# Patient Record
Sex: Female | Born: 2000 | Race: Black or African American | Hispanic: No | Marital: Single | Smoking: Never smoker
Health system: Southern US, Community
[De-identification: ages and names within clinical notes are randomized; demographics above are authoritative.]

## PROBLEM LIST (undated history)

## (undated) DIAGNOSIS — O24419 Gestational diabetes mellitus in pregnancy, unspecified control: Secondary | ICD-10-CM

## (undated) DIAGNOSIS — K2970 Gastritis, unspecified, without bleeding: Secondary | ICD-10-CM

## (undated) DIAGNOSIS — F419 Anxiety disorder, unspecified: Secondary | ICD-10-CM

## (undated) DIAGNOSIS — D649 Anemia, unspecified: Secondary | ICD-10-CM

## (undated) DIAGNOSIS — B009 Herpesviral infection, unspecified: Secondary | ICD-10-CM

## (undated) DIAGNOSIS — F32A Depression, unspecified: Secondary | ICD-10-CM

## (undated) DIAGNOSIS — F99 Mental disorder, not otherwise specified: Secondary | ICD-10-CM

---
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
## 2018-04-03 ENCOUNTER — Emergency Department (HOSPITAL_COMMUNITY)
Admission: EM | Admit: 2018-04-03 | Discharge: 2018-04-03 | Disposition: A | Discharge status: Home / Self Care | Payer: Medicaid Other | Attending: Emergency Medicine | Admitting: Emergency Medicine

## 2018-04-03 ENCOUNTER — Encounter (HOSPITAL_COMMUNITY): Payer: Self-pay

## 2018-04-03 ENCOUNTER — Other Ambulatory Visit: Payer: Self-pay

## 2018-04-03 DIAGNOSIS — R1013 Epigastric pain: Secondary | ICD-10-CM | POA: Insufficient documentation

## 2018-04-03 DIAGNOSIS — K2920 Alcoholic gastritis without bleeding: Secondary | ICD-10-CM | POA: Insufficient documentation

## 2018-04-03 DIAGNOSIS — F1290 Cannabis use, unspecified, uncomplicated: Secondary | ICD-10-CM

## 2018-04-03 DIAGNOSIS — F1210 Cannabis abuse, uncomplicated: Secondary | ICD-10-CM | POA: Insufficient documentation

## 2018-04-03 DIAGNOSIS — R112 Nausea with vomiting, unspecified: Secondary | ICD-10-CM | POA: Diagnosis not present

## 2018-04-03 LAB — CBC WITH DIFFERENTIAL/PLATELET
Basophils Absolute: 0 10*3/uL (ref 0.0–0.1)
Basophils Relative: 0 %
Eosinophils Absolute: 0 10*3/uL (ref 0.0–0.7)
Eosinophils Relative: 0 %
HCT: 37 % (ref 36.0–46.0)
HEMOGLOBIN: 12.3 g/dL (ref 12.0–15.0)
LYMPHS ABS: 0.7 10*3/uL (ref 0.7–4.0)
Lymphocytes Relative: 14 %
MCH: 30.1 pg (ref 26.0–34.0)
MCHC: 33.2 g/dL (ref 30.0–36.0)
MCV: 90.5 fL (ref 78.0–100.0)
MONO ABS: 0.1 10*3/uL (ref 0.1–1.0)
MONOS PCT: 2 %
NEUTROS ABS: 4.2 10*3/uL (ref 1.7–7.7)
Neutrophils Relative %: 84 %
Platelets: 252 10*3/uL (ref 150–400)
RBC: 4.09 MIL/uL (ref 3.87–5.11)
RDW: 13.3 % (ref 11.5–15.5)
WBC: 4.9 10*3/uL (ref 4.0–10.5)

## 2018-04-03 LAB — URINALYSIS, ROUTINE W REFLEX MICROSCOPIC
Bacteria, UA: NONE SEEN
Bilirubin Urine: NEGATIVE
Glucose, UA: NEGATIVE mg/dL
Hgb urine dipstick: NEGATIVE
KETONES UR: 5 mg/dL — AB
Leukocytes, UA: NEGATIVE
Nitrite: NEGATIVE
PH: 6 (ref 5.0–8.0)
PROTEIN: 100 mg/dL — AB
Specific Gravity, Urine: 1.031 — ABNORMAL HIGH (ref 1.005–1.030)

## 2018-04-03 LAB — COMPREHENSIVE METABOLIC PANEL
ALBUMIN: 4.5 g/dL (ref 3.5–5.0)
ALK PHOS: 55 U/L (ref 38–126)
ALT: 27 U/L (ref 0–44)
AST: 22 U/L (ref 15–41)
Anion gap: 13 (ref 5–15)
BUN: 15 mg/dL (ref 6–20)
CO2: 25 mmol/L (ref 22–32)
Calcium: 9.6 mg/dL (ref 8.9–10.3)
Chloride: 109 mmol/L (ref 98–111)
Creatinine, Ser: 0.81 mg/dL (ref 0.44–1.00)
GFR calc Af Amer: >60 mL/min (ref >60)
GFR calc non Af Amer: >60 mL/min (ref >60)
GLUCOSE: 108 mg/dL — AB (ref 70–99)
Potassium: 3.9 mmol/L (ref 3.5–5.1)
Sodium: 147 mmol/L — ABNORMAL HIGH (ref 135–145)
TOTAL PROTEIN: 8.5 g/dL — AB (ref 6.5–8.1)
Total Bilirubin: 0.5 mg/dL (ref 0.3–1.2)

## 2018-04-03 LAB — LIPASE, BLOOD: Lipase: 29 U/L (ref 11–51)

## 2018-04-03 LAB — I-STAT BETA HCG BLOOD, ED (MC, WL, AP ONLY): I-stat hCG, quantitative: <5 m[IU]/mL (ref <5)

## 2018-04-03 MED ORDER — RANITIDINE HCL 150 MG PO TABS: 150.0000 mg | ORAL_TABLET | Freq: Two times a day (BID) | ORAL | 0 refills | Status: DC

## 2018-04-03 MED ORDER — FAMOTIDINE IN NACL 20-0.9 MG/50ML-% IV SOLN
20.0000 mg | Freq: Once | INTRAVENOUS | Status: AC
  Administered 2018-04-03: 20 mg via INTRAVENOUS
  Filled 2018-04-03: qty 50

## 2018-04-03 MED ORDER — ONDANSETRON HCL 4 MG/2ML IJ SOLN
4.0000 mg | Freq: Once | INTRAMUSCULAR | Status: AC
  Administered 2018-04-03: 4 mg via INTRAVENOUS
  Filled 2018-04-03: qty 2

## 2018-04-03 MED ORDER — ONDANSETRON 4 MG PO TBDP: 4.0000 mg | ORAL_TABLET | Freq: Three times a day (TID) | ORAL | 0 refills | Status: DC | PRN

## 2018-04-03 MED ORDER — GI COCKTAIL ~~LOC~~
30.0000 mL | Freq: Once | ORAL | Status: AC
  Administered 2018-04-03: 30 mL via ORAL
  Filled 2018-04-03: qty 30

## 2018-04-03 MED ORDER — SODIUM CHLORIDE 0.9 % IV BOLUS
1000.0000 mL | Freq: Once | INTRAVENOUS | Status: AC
  Administered 2018-04-03: 1000 mL via INTRAVENOUS

## 2018-04-03 NOTE — Discharge Instructions (Addendum)
Your abdominal pain and nausea/vomiting is likely from gastritis or an ulcer, probably from your alcohol consumption last night, and potentially contributed to by your marijuana use. It's very important that you avoid alcohol and marijuana use! You will need to take zantac as directed, and avoid spicy/fatty/acidic foods, avoid soda/coffee/tea/alcohol. Avoid laying down flat within 30 minutes of eating. Avoid NSAIDs like ibuprofen/aleve/motrin/etc on an empty stomach. May consider using over the counter tums/maalox as needed for additional relief. Use zofran as directed as needed for nausea. Use tylenol as needed for pain. Follow up with your regular doctor in 5-7 days for recheck of symptoms. Return to the ER for changes or worsening symptoms.  Abdominal (belly) pain can be caused by many things. Your caregiver performed an examination and possibly ordered blood/urine tests and imaging (CT scan, x-rays, ultrasound). Many cases can be observed and treated at home after initial evaluation in the emergency department. Even though you are being discharged home, abdominal pain can be unpredictable. Therefore, you need a repeated exam if your pain does not resolve, returns, or worsens. Most patients with abdominal pain don't have to be admitted to the hospital or have surgery, but serious problems like appendicitis and gallbladder attacks can start out as nonspecific pain. Many abdominal conditions cannot be diagnosed in one visit, so follow-up evaluations are very important. SEEK IMMEDIATE MEDICAL ATTENTION IF YOU DEVELOP ANY OF THE FOLLOWING SYMPTOMS: The pain does not go away or becomes severe.  A temperature above 101 develops.  Repeated vomiting occurs (multiple episodes).  The pain becomes localized to portions of the abdomen. The right side could possibly be appendicitis. In an adult, the left lower portion of the abdomen could be colitis or diverticulitis.  Blood is being passed in stools or vomit (bright  red or black tarry stools).  Return also if you develop chest pain, difficulty breathing, dizziness or fainting, or become confused, poorly responsive, or inconsolable (young children). The constipation stays for more than 4 days.  There is belly (abdominal) or rectal pain.  You do not seem to be getting better.

## 2018-04-03 NOTE — ED Notes (Signed)
Pt's ultrasound IV infiltrated.
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
## 2018-04-03 NOTE — ED Triage Notes (Signed)
She c/o n/v x ~15 episodes since yesterday. She states she did have a "normal" b.m. Yesterday. She is with her mom and is in no distress.

## 2019-06-10 ENCOUNTER — Emergency Department (HOSPITAL_COMMUNITY): Payer: Medicaid Other

## 2019-06-10 ENCOUNTER — Encounter (HOSPITAL_COMMUNITY): Payer: Self-pay

## 2019-06-10 ENCOUNTER — Other Ambulatory Visit: Payer: Self-pay

## 2019-06-10 ENCOUNTER — Emergency Department (HOSPITAL_COMMUNITY)
Admission: EM | Admit: 2019-06-10 | Discharge: 2019-06-10 | Disposition: A | Discharge status: Home / Self Care | Payer: Medicaid Other | Attending: Emergency Medicine | Admitting: Emergency Medicine

## 2019-06-10 DIAGNOSIS — Z793 Long term (current) use of hormonal contraceptives: Secondary | ICD-10-CM | POA: Insufficient documentation

## 2019-06-10 DIAGNOSIS — K2901 Acute gastritis with bleeding: Secondary | ICD-10-CM | POA: Insufficient documentation

## 2019-06-10 DIAGNOSIS — Z79899 Other long term (current) drug therapy: Secondary | ICD-10-CM | POA: Insufficient documentation

## 2019-06-10 DIAGNOSIS — Z20828 Contact with and (suspected) exposure to other viral communicable diseases: Secondary | ICD-10-CM | POA: Insufficient documentation

## 2019-06-10 DIAGNOSIS — N390 Urinary tract infection, site not specified: Secondary | ICD-10-CM | POA: Insufficient documentation

## 2019-06-10 LAB — COMPREHENSIVE METABOLIC PANEL
ALT: 25 U/L (ref 0–44)
AST: 19 U/L (ref 15–41)
Albumin: 3.7 g/dL (ref 3.5–5.0)
Alkaline Phosphatase: 59 U/L (ref 38–126)
Anion gap: 11 (ref 5–15)
BUN: 12 mg/dL (ref 6–20)
CO2: 22 mmol/L (ref 22–32)
Calcium: 9 mg/dL (ref 8.9–10.3)
Chloride: 106 mmol/L (ref 98–111)
Creatinine, Ser: 0.77 mg/dL (ref 0.44–1.00)
GFR calc Af Amer: >60 mL/min (ref >60)
GFR calc non Af Amer: >60 mL/min (ref >60)
Glucose, Bld: 88 mg/dL (ref 70–99)
Potassium: 3.5 mmol/L (ref 3.5–5.1)
Sodium: 139 mmol/L (ref 135–145)
Total Bilirubin: 0.4 mg/dL (ref 0.3–1.2)
Total Protein: 7.7 g/dL (ref 6.5–8.1)

## 2019-06-10 LAB — URINALYSIS, ROUTINE W REFLEX MICROSCOPIC
Bilirubin Urine: NEGATIVE
Glucose, UA: NEGATIVE mg/dL
Ketones, ur: NEGATIVE mg/dL
Nitrite: POSITIVE — AB
Protein, ur: 30 mg/dL — AB
Specific Gravity, Urine: 1.024 (ref 1.005–1.030)
WBC, UA: >50 WBC/hpf — ABNORMAL HIGH (ref 0–5)
pH: 6 (ref 5.0–8.0)

## 2019-06-10 LAB — CBC
HCT: 37.2 % (ref 36.0–46.0)
Hemoglobin: 11.7 g/dL — ABNORMAL LOW (ref 12.0–15.0)
MCH: 29.5 pg (ref 26.0–34.0)
MCHC: 31.5 g/dL (ref 30.0–36.0)
MCV: 93.9 fL (ref 80.0–100.0)
Platelets: 141 10*3/uL — ABNORMAL LOW (ref 150–400)
RBC: 3.96 MIL/uL (ref 3.87–5.11)
RDW: 12.2 % (ref 11.5–15.5)
WBC: 3.9 10*3/uL — ABNORMAL LOW (ref 4.0–10.5)
nRBC: 0 % (ref 0.0–0.2)

## 2019-06-10 LAB — I-STAT BETA HCG BLOOD, ED (NOT ORDERABLE): I-stat hCG, quantitative: <5 m[IU]/mL (ref <5)

## 2019-06-10 LAB — LIPASE, BLOOD: Lipase: 23 U/L (ref 11–51)

## 2019-06-10 MED ORDER — CEPHALEXIN 500 MG PO CAPS
500.0000 mg | ORAL_CAPSULE | Freq: Once | ORAL | Status: AC
  Administered 2019-06-10: 500 mg via ORAL
  Filled 2019-06-10: qty 1

## 2019-06-10 MED ORDER — OMEPRAZOLE 20 MG PO CPDR: 20.0000 mg | DELAYED_RELEASE_CAPSULE | Freq: Every day | ORAL | 0 refills | Status: DC

## 2019-06-10 MED ORDER — PANTOPRAZOLE SODIUM 40 MG PO TBEC
40.0000 mg | DELAYED_RELEASE_TABLET | Freq: Every day | ORAL | Status: DC
  Administered 2019-06-10: 40 mg via ORAL
  Filled 2019-06-10: qty 1

## 2019-06-10 MED ORDER — SUCRALFATE 1 G PO TABS
1.0000 g | ORAL_TABLET | Freq: Once | ORAL | Status: AC
  Administered 2019-06-10: 1 g via ORAL
  Filled 2019-06-10: qty 1

## 2019-06-10 MED ORDER — SUCRALFATE 1 G PO TABS: 1.0000 g | ORAL_TABLET | Freq: Three times a day (TID) | ORAL | 0 refills | Status: DC

## 2019-06-10 MED ORDER — SODIUM CHLORIDE 0.9% FLUSH: 3.0000 mL | Freq: Once | INTRAVENOUS | Status: DC

## 2019-06-10 MED ORDER — CEPHALEXIN 500 MG PO CAPS: 500.0000 mg | ORAL_CAPSULE | Freq: Two times a day (BID) | ORAL | 0 refills | Status: AC

## 2019-06-10 NOTE — ED Notes (Signed)
ED Provider at bedside. 

## 2019-06-10 NOTE — ED Triage Notes (Signed)
Pt states she has been having headaches on and off, along with abd pain. Pt states she coughed up blood on the way to work. Pt states headaches around temples and back of head. Pt states abd pain is LLQ. Pt states blood coughed up was "a couple clots". Pt states emesis x 2 yesterday, which was yellow. Pt c/o generalized weakness.

## 2019-06-10 NOTE — ED Provider Notes (Signed)
[facility]-EMERGENCY DEPT Provider Note   CSN: [id number] Arrival date & time: 06/10/19  1517     History   Chief Complaint Chief Complaint  Patient presents with  . Abdominal Pain    HPI Deborah Schmidt is a 18 y.o. female.     HPI Patient presents with several complaints per She notes that her past 2 or 3 days, without clear precipitant she has felt generally poorly.  No fever, though she does have chills, generalized discomfort, and left upper abdominal and left flank pain. There is associated nausea, anorexia, and hematemesis, not hemoptysis, described as a nauseous sensation with emesis productive of blood, with subsequent coughing. No dyspnea, no chest pain. Patient is generally well, does have a history of gastritis, but no longer takes medication for this. She has no dysuria. Abdominal pain itself is sore, moderate, pressure-like.  No relief with OTC medication. There are no active problems to display for this patient.   History reviewed. No pertinent surgical history.   OB History   No obstetric history on file.      Home Medications    Prior to Admission medications   Medication Sig Start Date End Date Taking? Authorizing Provider  ESTARYLLA 0.25-35 MG-MCG tablet Take 1 tablet by mouth daily.  01/30/18  Yes [provider]  ibuprofen (ADVIL,MOTRIN) 200 MG tablet Take 200 mg by mouth every 6 (six) hours as needed for moderate pain.   Yes [provider]  cephALEXin (KEFLEX) 500 MG capsule Take 1 capsule (500 mg total) by mouth 2 (two) times daily for 5 days. 06/10/19 06/15/19  Gerhard Munch, MD  omeprazole (PRILOSEC) 20 MG capsule Take 1 capsule (20 mg total) by mouth daily. Take one tablet daily 06/10/19   Gerhard Munch, MD  ondansetron (ZOFRAN ODT) 4 MG disintegrating tablet Take 1 tablet (4 mg total) by mouth every 8 (eight) hours as needed for nausea or vomiting. Patient not taking: Reported on 06/10/2019 04/03/18    Street, [place], PA-C  ranitidine (ZANTAC) 150 MG tablet Take 1 tablet (150 mg total) by mouth 2 (two) times daily. Patient not taking: Reported on 06/10/2019 04/03/18   Street, [place], PA-C  sucralfate (CARAFATE) 1 g tablet Take 1 tablet (1 g total) by mouth 4 (four) times daily -  with meals and at bedtime. 06/10/19   Gerhard Munch, MD    Family History No family history on file.  Social History Social History   Tobacco Use  . Smoking status: Never Smoker  . Smokeless tobacco: Never Used  Substance Use Topics  . Alcohol use: Yes    Comment: social  . Drug use: Yes    Types: Marijuana     Allergies   Patient has no known allergies.   Review of Systems Review of Systems  Constitutional:       Per HPI, otherwise negative  HENT:       Per HPI, otherwise negative  Respiratory:       Per HPI, otherwise negative  Cardiovascular:       Per HPI, otherwise negative  Gastrointestinal: Positive for abdominal pain, nausea and vomiting.  Endocrine:       Negative aside from HPI  Genitourinary:       Neg aside from HPI   Musculoskeletal:       Per HPI, otherwise negative  Skin: Negative.   Neurological: Negative for syncope.     Physical Exam Updated Vital Signs BP 118/77   Pulse (!) 59  Temp 98.9 F (37.2 C) (Oral)   Resp 14   Wt 67.8 kg   LMP 05/23/2019   SpO2 100%   Physical Exam Vitals signs and nursing note reviewed.  Constitutional:      General: She is not in acute distress.    Appearance: She is well-developed.  HENT:     Head: Normocephalic and atraumatic.  Eyes:     Conjunctiva/sclera: Conjunctivae normal.  Cardiovascular:     Rate and Rhythm: Normal rate and regular rhythm.  Pulmonary:     Effort: Pulmonary effort is normal. No respiratory distress.     Breath sounds: Normal breath sounds. No stridor.  Abdominal:     General: There is no distension.     Comments: Abdomen is soft, nonperitoneal, with minimal tenderness to palpation in  the left inferior costal region, no guarding.  Skin:    General: Skin is warm and dry.  Neurological:     Mental Status: She is alert and oriented to person, place, and time.     Cranial Nerves: No cranial nerve deficit.      ED Treatments / Results  Labs (all labs ordered are listed, but only abnormal results are displayed) Labs Reviewed  CBC - Abnormal; Notable for the following components:      Result Value   WBC 3.9 (*)    Hemoglobin 11.7 (*)    Platelets 141 (*)    All other components within normal limits  URINALYSIS, ROUTINE W REFLEX MICROSCOPIC - Abnormal; Notable for the following components:   APPearance HAZY (*)    Hgb urine dipstick MODERATE (*)    Protein, ur 30 (*)    Nitrite POSITIVE (*)    Leukocytes,Ua MODERATE (*)    WBC, UA >50 (*)    Bacteria, UA RARE (*)    All other components within normal limits  SARS CORONAVIRUS 2 (TAT 6-24 HRS)  LIPASE, BLOOD  COMPREHENSIVE METABOLIC PANEL  I-STAT BETA HCG BLOOD, ED ([facility], [facility], [facility] ONLY)  I-STAT BETA HCG BLOOD, ED (NOT ORDERABLE)    Radiology Dg Chest 2 View  Result Date: 06/10/2019 CLINICAL DATA:  Hemoptysis EXAM: CHEST - 2 VIEW COMPARISON:  None. FINDINGS: Lungs are clear. Heart size and pulmonary vascularity are normal. No adenopathy. No pneumothorax. No bone lesions. IMPRESSION: No abnormality noted. A cause for hemoptysis has not been established with this study. Electronically Signed   By: Lowella Grip III M.D.   On: 06/10/2019 09:03    Procedures Procedures (including critical care time)  Medications Ordered in ED Medications  sodium chloride flush (NS) 0.9 % injection 3 mL (has no administration in time range)  cephALEXin (KEFLEX) capsule 500 mg (has no administration in time range)  pantoprazole (PROTONIX) EC tablet 40 mg (has no administration in time range)  sucralfate (CARAFATE) tablet 1 g (has no administration in time range)     Initial Impression / Assessment and Plan / ED Course  I  have reviewed the triage vital signs and the nursing notes.  Pertinent labs & imaging results that were available during my care of the patient were reviewed by me and considered in my medical decision making (see chart for details).  This generally well-appearing young female presents with hematemesis, not hemoptysis, left upper quadrant abdominal pain, generalized discomfort.  When she is awake, alert, afebrile, with no hypotension, no evidence for bacteremia, sepsis, no evidence for exsanguination.  Patient does have a history of gastritis and greater pain in left upper quadrant, there  are some suspicion for recurrence of this.  On admission patient is found to have urinary tract infection.  With otherwise reassuring labs, the patient is appropriate for close outpatient follow-up, after initiation of antibiotics, meds for gastritis. Patient does not have classic Covid symptoms, but given the prevalence of this disease, her discomfort, test was sent as well.  Deborah Schmidt was evaluated in Emergency Department on 06/10/2019 for the symptoms described in the history of present illness. She was evaluated in the context of the global COVID-19 pandemic, which necessitated consideration that the patient might be at risk for infection with the SARS-CoV-2 virus that causes COVID-19. Institutional protocols and algorithms that pertain to the evaluation of patients at risk for COVID-19 are in a state of rapid change based on information released by regulatory bodies including the CDC and federal and state organizations. These policies and algorithms were followed during the patient's care in the ED.   Final Clinical Impressions(s) / ED Diagnoses   Final diagnoses:  Lower urinary tract infectious disease  Acute gastritis with hemorrhage, unspecified gastritis type    ED Discharge Orders         Ordered    omeprazole (PRILOSEC) 20 MG capsule  Daily     06/10/19 1248    sucralfate (CARAFATE) 1 g tablet  3  times daily with meals & bedtime    Note to Pharmacy: Take for one week   06/10/19 1248    cephALEXin (KEFLEX) 500 MG capsule  2 times daily     06/10/19 1248           Gerhard MunchLockwood, Jaquis Picklesimer, MD 06/10/19 1251

## 2019-06-10 NOTE — Discharge Instructions (Addendum)
As discussed, your evaluation today has been largely reassuring.  But, it is important that you monitor your condition carefully, and do not hesitate to return to the ED if you develop new, or concerning changes in your condition.  You have been diagnosed both with urinary tract disease and gastritis. Please be sure to obtain your medication and take it as directed.  A Covid test has been sent. Please self quarantine until this result is available; likely within the next 24/48 hours.  Please follow-up with your physician for appropriate ongoing care.

## 2019-06-11 LAB — SARS CORONAVIRUS 2 (TAT 6-24 HRS): SARS Coronavirus 2: NEGATIVE

## 2020-03-23 ENCOUNTER — Other Ambulatory Visit: Payer: Self-pay

## 2020-03-23 ENCOUNTER — Ambulatory Visit (HOSPITAL_COMMUNITY)
Admission: EM | Admit: 2020-03-23 | Discharge: 2020-03-23 | Disposition: A | Discharge status: Home / Self Care | Payer: BC Managed Care – PPO | Attending: Emergency Medicine | Admitting: Emergency Medicine

## 2020-03-23 ENCOUNTER — Encounter (HOSPITAL_COMMUNITY): Payer: Self-pay

## 2020-03-23 DIAGNOSIS — R5383 Other fatigue: Secondary | ICD-10-CM

## 2020-03-23 DIAGNOSIS — R519 Headache, unspecified: Secondary | ICD-10-CM | POA: Diagnosis not present

## 2020-03-23 DIAGNOSIS — O210 Mild hyperemesis gravidarum: Secondary | ICD-10-CM

## 2020-03-23 DIAGNOSIS — R1110 Vomiting, unspecified: Secondary | ICD-10-CM

## 2020-03-23 DIAGNOSIS — Z3201 Encounter for pregnancy test, result positive: Secondary | ICD-10-CM | POA: Diagnosis not present

## 2020-03-23 DIAGNOSIS — Z3A01 Less than 8 weeks gestation of pregnancy: Secondary | ICD-10-CM

## 2020-03-23 LAB — POC URINE PREG, ED: Preg Test, Ur: POSITIVE — AB

## 2020-03-23 LAB — POCT URINALYSIS DIPSTICK, ED / UC
Bilirubin Urine: NEGATIVE
Glucose, UA: NEGATIVE mg/dL
Nitrite: NEGATIVE
Protein, ur: 30 mg/dL — AB
Specific Gravity, Urine: 1.025 (ref 1.005–1.030)
Urobilinogen, UA: 0.2 mg/dL (ref 0.0–1.0)
pH: 7 (ref 5.0–8.0)

## 2020-03-23 MED ORDER — PRENATAL COMPLETE 14-0.4 MG PO TABS
1.0000 | ORAL_TABLET | Freq: Every day | ORAL | 0 refills | Status: DC
Start: 2020-03-23 — End: 2020-11-21

## 2020-03-23 MED ORDER — DOXYLAMINE SUCCINATE (SLEEP) 25 MG PO TABS
12.5000 mg | ORAL_TABLET | Freq: Three times a day (TID) | ORAL | 0 refills | Status: DC | PRN
Start: 2020-03-23 — End: 2020-11-19

## 2020-03-23 MED ORDER — VITAMIN B-6 25 MG PO TABS
25.0000 mg | ORAL_TABLET | Freq: Three times a day (TID) | ORAL | 0 refills | Status: DC | PRN
Start: 2020-03-23 — End: 2020-11-19

## 2020-03-23 NOTE — Discharge Instructions (Signed)
Small frequent sips of fluids- Pedialyte, Gatorade, water, broth- to maintain hydration.   Doxylamine and pyridoxine combination to help with vomiting, best if taken today. Doxylamine causes drowsiness, however, so if unable to tolerate during the day take before bed.  5 weeks 2 days, estimated due date of 11/21/2020, per last period.  Start prenatal vitamin.  Stop smoking.  Please follow up with OB for first prenatal.  Go to [facility] hospital for any increased pain or vaginal bleeding.

## 2020-03-23 NOTE — ED Triage Notes (Signed)
Pt presents with vomiting and headache for over a week; pt believes she may be pregnant.

## 2020-03-23 NOTE — ED Notes (Signed)
Patient discharged by this nurse.  Reviewed instructions and answered questions.

## 2020-03-23 NOTE — ED Provider Notes (Signed)
[facility]    CSN: [id number] Arrival date & time: 03/23/20  1032      History   Chief Complaint Chief Complaint  Patient presents with   Possible Pregnancy   Vomiting   Headache   Fatigue    HPI Deborah Schmidt is a 19 y.o. female.   Deborah Schmidt presents with complaints of nausea and vomiting. Headaches. Vomiting for the past 3 days. Feeling weak. No diarrhea. Yesterday vomited 3 times. Today vomited 5 times. No bloody emesis, but some specks of blood noted. Some abdominal pain, lower abdomen, which comes and goes. Constant nausea. No urinary symptoms. No vaginal symptoms. Took two home pregnancy tests, one was positive, the following one was negative. Has missed period. Tends to have regular period. Not on birth control. No previous pregnancies. LMP 7/1. Has been drinking fluids, which stays down. No known ill contacts. No history of covid-19 and has not received vaccination. She does smoke marijuana three times a day.    ROS per HPI, negative if not otherwise mentioned.      History reviewed. No pertinent past medical history.  There are no problems to display for this patient.   History reviewed. No pertinent surgical history.  OB History   No obstetric history on file.      Home Medications    Prior to Admission medications   Medication Sig Start Date End Date Taking? Authorizing Provider  doxylamine, Sleep, (UNISOM) 25 MG tablet Take 0.5 tablets (12.5 mg total) by mouth 3 (three) times daily as needed. 03/23/20   Georgetta Haber, NP  ESTARYLLA 0.25-35 MG-MCG tablet Take 1 tablet by mouth daily.  01/30/18   [provider]  ibuprofen (ADVIL,MOTRIN) 200 MG tablet Take 200 mg by mouth every 6 (six) hours as needed for moderate pain.    [provider]  omeprazole (PRILOSEC) 20 MG capsule Take 1 capsule (20 mg total) by mouth daily. Take one tablet daily 06/10/19   Gerhard Munch, MD  ondansetron (ZOFRAN ODT) 4 MG disintegrating  tablet Take 1 tablet (4 mg total) by mouth every 8 (eight) hours as needed for nausea or vomiting. Patient not taking: Reported on 06/10/2019 04/03/18   Street, [place], [place]  Prenatal Vit-Fe Fumarate-FA (PRENATAL COMPLETE) 14-0.4 MG TABS Take 1 tablet by mouth daily. 03/23/20   Georgetta Haber, NP  ranitidine (ZANTAC) 150 MG tablet Take 1 tablet (150 mg total) by mouth 2 (two) times daily. Patient not taking: Reported on 06/10/2019 04/03/18   Street, [place], PA-C  sucralfate (CARAFATE) 1 g tablet Take 1 tablet (1 g total) by mouth 4 (four) times daily -  with meals and at bedtime. 06/10/19   Gerhard Munch, MD  vitamin B-6 (PYRIDOXINE) 25 MG tablet Take 1 tablet (25 mg total) by mouth 3 (three) times daily as needed. 03/23/20   Georgetta Haber, NP    Family History Family History  Family history unknown: Yes    Social History Social History   Tobacco Use   Smoking status: Never Smoker   Smokeless tobacco: Never Used  Substance Use Topics   Alcohol use: Yes    Comment: social   Drug use: Yes    Types: Marijuana     Allergies   Patient has no known allergies.   Review of Systems Review of Systems   Physical Exam Triage Vital Signs ED Triage Vitals  Enc Vitals Group     BP 03/23/20 1102 108/71     Pulse Rate 03/23/20  1102 80     Resp 03/23/20 1102 20     Temp 03/23/20 1102 99.3 F (37.4 C)     Temp Source 03/23/20 1102 Oral     SpO2 03/23/20 1102 100 %     Weight --      Height --      Head Circumference --      Peak Flow --      Pain Score 03/23/20 1104 5     Pain Loc --      Pain Edu? --      Excl. in GC? --    No data found.  Updated Vital Signs BP 108/71 (BP Location: Left Arm)    Pulse 80    Temp 99.3 F (37.4 C) (Oral)    Resp 20    LMP 02/20/2020    SpO2 100%   Visual Acuity Right Eye Distance:   Left Eye Distance:   Bilateral Distance:    Right Eye Near:   Left Eye Near:    Bilateral Near:     Physical Exam Constitutional:       General: She is not in acute distress.    Appearance: She is well-developed.  Cardiovascular:     Rate and Rhythm: Normal rate.  Pulmonary:     Effort: Pulmonary effort is normal.  Abdominal:     Palpations: Abdomen is soft.     Tenderness: There is no abdominal tenderness.  Skin:    General: Skin is warm and dry.  Neurological:     Mental Status: She is alert and oriented to person, place, and time.      UC Treatments / Results  Labs (all labs ordered are listed, but only abnormal results are displayed) Labs Reviewed  POCT URINALYSIS DIPSTICK, ED / UC - Abnormal; Notable for the following components:      Result Value   Ketones, ur TRACE (*)    Hgb urine dipstick MODERATE (*)    Protein, ur 30 (*)    Leukocytes,Ua TRACE (*)    All other components within normal limits  POC URINE PREG, ED - Abnormal; Notable for the following components:   Preg Test, Ur POSITIVE (*)    All other components within normal limits    EKG   Radiology No results found.  Procedures Procedures (including critical care time)  Medications Ordered in UC Medications - No data to display  Initial Impression / Assessment and Plan / UC Course  I have reviewed the triage vital signs and the nursing notes.  Pertinent labs & imaging results that were available during my care of the patient were reviewed by me and considered in my medical decision making (see chart for details).     Positive pregnancy test today, per LMP 5 w 2d pregnant. Morning sickness management discussed. Prenatal care discussed and encouraged to follow up with OB. Return precautions provided. Patient verbalized understanding and agreeable to plan.    Final Clinical Impressions(s) / UC Diagnoses   Final diagnoses:  Morning sickness  Less than [redacted] weeks gestation of pregnancy     Discharge Instructions     Small frequent sips of fluids- Pedialyte, Gatorade, water, broth- to maintain hydration.   Doxylamine and pyridoxine  combination to help with vomiting, best if taken today. Doxylamine causes drowsiness, however, so if unable to tolerate during the day take before bed.  5 weeks 2 days, estimated due date of 11/21/2020, per last period.  Start prenatal vitamin.  Stop smoking.  Please follow up with OB for first prenatal.  Go to [facility] hospital for any increased pain or vaginal bleeding.     ED Prescriptions    Medication Sig Dispense Auth. Provider   vitamin B-6 (PYRIDOXINE) 25 MG tablet Take 1 tablet (25 mg total) by mouth 3 (three) times daily as needed. 60 tablet Linus Mako B, NP   doxylamine, Sleep, (UNISOM) 25 MG tablet Take 0.5 tablets (12.5 mg total) by mouth 3 (three) times daily as needed. 30 tablet Linus Mako B, NP   Prenatal Vit-Fe Fumarate-FA (PRENATAL COMPLETE) 14-0.4 MG TABS Take 1 tablet by mouth daily. 60 tablet Georgetta Haber, NP     PDMP not reviewed this encounter.   Georgetta Haber, NP 03/24/20 [phone]

## 2020-04-16 LAB — OB RESULTS CONSOLE RUBELLA ANTIBODY, IGM: Rubella: IMMUNE

## 2020-04-16 LAB — OB RESULTS CONSOLE HEPATITIS B SURFACE ANTIGEN: Hepatitis B Surface Ag: NEGATIVE

## 2020-04-27 ENCOUNTER — Encounter (HOSPITAL_COMMUNITY): Payer: Self-pay

## 2020-04-27 ENCOUNTER — Other Ambulatory Visit: Payer: Self-pay

## 2020-04-27 DIAGNOSIS — Z5321 Procedure and treatment not carried out due to patient leaving prior to being seen by health care provider: Secondary | ICD-10-CM | POA: Insufficient documentation

## 2020-04-27 DIAGNOSIS — O26891 Other specified pregnancy related conditions, first trimester: Secondary | ICD-10-CM | POA: Diagnosis not present

## 2020-04-27 DIAGNOSIS — R1032 Left lower quadrant pain: Secondary | ICD-10-CM | POA: Insufficient documentation

## 2020-04-27 DIAGNOSIS — Z3A10 10 weeks gestation of pregnancy: Secondary | ICD-10-CM | POA: Insufficient documentation

## 2020-04-27 DIAGNOSIS — O219 Vomiting of pregnancy, unspecified: Secondary | ICD-10-CM | POA: Diagnosis not present

## 2020-04-27 LAB — URINALYSIS, ROUTINE W REFLEX MICROSCOPIC
Bilirubin Urine: NEGATIVE
Glucose, UA: NEGATIVE mg/dL
Hgb urine dipstick: NEGATIVE
Ketones, ur: 80 mg/dL — AB
Nitrite: NEGATIVE
Protein, ur: 30 mg/dL — AB
Specific Gravity, Urine: 1.025 (ref 1.005–1.030)
pH: 6 (ref 5.0–8.0)

## 2020-04-27 LAB — CBC
HCT: 39.3 % (ref 36.0–46.0)
Hemoglobin: 12.9 g/dL (ref 12.0–15.0)
MCH: 29.9 pg (ref 26.0–34.0)
MCHC: 32.8 g/dL (ref 30.0–36.0)
MCV: 91.2 fL (ref 80.0–100.0)
Platelets: 194 10*3/uL (ref 150–400)
RBC: 4.31 MIL/uL (ref 3.87–5.11)
RDW: 12 % (ref 11.5–15.5)
WBC: 3.9 10*3/uL — ABNORMAL LOW (ref 4.0–10.5)
nRBC: 0 % (ref 0.0–0.2)

## 2020-04-27 NOTE — ED Triage Notes (Signed)
Patient arrived stating she has been vomiting over the last week and having some left lower abdominal pain. Reports being [redacted] weeks pregnant. Declines any vaginal bleeding.

## 2020-04-28 ENCOUNTER — Emergency Department (HOSPITAL_COMMUNITY)
Admission: EM | Admit: 2020-04-28 | Discharge: 2020-04-28 | Disposition: A | Discharge status: Home / Self Care | Payer: BC Managed Care – PPO | Attending: Emergency Medicine | Admitting: Emergency Medicine

## 2020-04-28 HISTORY — DX: Gastritis, unspecified, without bleeding: K29.70

## 2020-04-28 LAB — COMPREHENSIVE METABOLIC PANEL
ALT: 17 U/L (ref 0–44)
AST: 18 U/L (ref 15–41)
Albumin: 3.7 g/dL (ref 3.5–5.0)
Alkaline Phosphatase: 51 U/L (ref 38–126)
Anion gap: 13 (ref 5–15)
BUN: 6 mg/dL (ref 6–20)
CO2: 22 mmol/L (ref 22–32)
Calcium: 9.4 mg/dL (ref 8.9–10.3)
Chloride: 103 mmol/L (ref 98–111)
Creatinine, Ser: 0.66 mg/dL (ref 0.44–1.00)
GFR calc Af Amer: >60 mL/min (ref >60)
GFR calc non Af Amer: >60 mL/min (ref >60)
Glucose, Bld: 94 mg/dL (ref 70–99)
Potassium: 3.4 mmol/L — ABNORMAL LOW (ref 3.5–5.1)
Sodium: 138 mmol/L (ref 135–145)
Total Bilirubin: 0.3 mg/dL (ref 0.3–1.2)
Total Protein: 7.9 g/dL (ref 6.5–8.1)

## 2020-04-28 LAB — LIPASE, BLOOD: Lipase: 26 U/L (ref 11–51)

## 2020-04-28 NOTE — ED Notes (Signed)
I called patient for a room and no one responded 

## 2020-08-30 LAB — OB RESULTS CONSOLE HIV ANTIBODY (ROUTINE TESTING): HIV: NONREACTIVE

## 2020-08-30 LAB — OB RESULTS CONSOLE RPR: RPR: NONREACTIVE

## 2020-11-01 LAB — OB RESULTS CONSOLE GC/CHLAMYDIA
Chlamydia: POSITIVE
Gonorrhea: NEGATIVE

## 2020-11-01 LAB — OB RESULTS CONSOLE GBS: GBS: NEGATIVE

## 2020-11-17 ENCOUNTER — Inpatient Hospital Stay (HOSPITAL_COMMUNITY): Payer: Medicaid Other | Admitting: Anesthesiology

## 2020-11-17 ENCOUNTER — Other Ambulatory Visit: Payer: Self-pay

## 2020-11-17 ENCOUNTER — Encounter (HOSPITAL_COMMUNITY)
Admission: AD | Disposition: A | Discharge status: Home / Self Care | Payer: Self-pay | Source: Home / Self Care | Attending: Obstetrics and Gynecology

## 2020-11-17 ENCOUNTER — Encounter (HOSPITAL_COMMUNITY): Payer: Self-pay | Admitting: Obstetrics and Gynecology

## 2020-11-17 ENCOUNTER — Inpatient Hospital Stay (HOSPITAL_COMMUNITY)
Admission: AD | Admit: 2020-11-17 | Discharge: 2020-11-21 | DRG: 788 | Disposition: A | Discharge status: Home / Self Care | Payer: Medicaid Other | Attending: Obstetrics and Gynecology | Admitting: Obstetrics and Gynecology

## 2020-11-17 DIAGNOSIS — Z20822 Contact with and (suspected) exposure to covid-19: Secondary | ICD-10-CM | POA: Diagnosis present

## 2020-11-17 DIAGNOSIS — O99344 Other mental disorders complicating childbirth: Secondary | ICD-10-CM | POA: Diagnosis present

## 2020-11-17 DIAGNOSIS — O1415 Severe pre-eclampsia, complicating the puerperium: Secondary | ICD-10-CM | POA: Diagnosis not present

## 2020-11-17 DIAGNOSIS — O1414 Severe pre-eclampsia complicating childbirth: Principal | ICD-10-CM | POA: Diagnosis present

## 2020-11-17 DIAGNOSIS — O99214 Obesity complicating childbirth: Secondary | ICD-10-CM | POA: Diagnosis present

## 2020-11-17 DIAGNOSIS — O9902 Anemia complicating childbirth: Secondary | ICD-10-CM | POA: Diagnosis present

## 2020-11-17 DIAGNOSIS — F32A Depression, unspecified: Secondary | ICD-10-CM | POA: Diagnosis present

## 2020-11-17 DIAGNOSIS — O24420 Gestational diabetes mellitus in childbirth, diet controlled: Secondary | ICD-10-CM | POA: Diagnosis present

## 2020-11-17 DIAGNOSIS — O26893 Other specified pregnancy related conditions, third trimester: Secondary | ICD-10-CM | POA: Diagnosis present

## 2020-11-17 DIAGNOSIS — O24430 Gestational diabetes mellitus in the puerperium, diet controlled: Secondary | ICD-10-CM | POA: Diagnosis not present

## 2020-11-17 DIAGNOSIS — Z3A39 39 weeks gestation of pregnancy: Secondary | ICD-10-CM | POA: Diagnosis not present

## 2020-11-17 HISTORY — DX: Anxiety disorder, unspecified: F41.9

## 2020-11-17 HISTORY — DX: Anemia, unspecified: D64.9

## 2020-11-17 HISTORY — DX: Gestational diabetes mellitus in pregnancy, unspecified control: O24.419

## 2020-11-17 HISTORY — DX: Depression, unspecified: F32.A

## 2020-11-17 HISTORY — DX: Mental disorder, not otherwise specified: F99

## 2020-11-17 LAB — COMPREHENSIVE METABOLIC PANEL
ALT: 12 U/L (ref 0–44)
AST: 15 U/L (ref 15–41)
Albumin: 2.2 g/dL — ABNORMAL LOW (ref 3.5–5.0)
Alkaline Phosphatase: 231 U/L — ABNORMAL HIGH (ref 38–126)
Anion gap: 5 (ref 5–15)
BUN: 6 mg/dL (ref 6–20)
CO2: 24 mmol/L (ref 22–32)
Calcium: 8.5 mg/dL — ABNORMAL LOW (ref 8.9–10.3)
Chloride: 110 mmol/L (ref 98–111)
Creatinine, Ser: 0.71 mg/dL (ref 0.44–1.00)
GFR, Estimated: >60 mL/min (ref >60)
Glucose, Bld: 87 mg/dL (ref 70–99)
Potassium: 3.3 mmol/L — ABNORMAL LOW (ref 3.5–5.1)
Sodium: 139 mmol/L (ref 135–145)
Total Bilirubin: 0.4 mg/dL (ref 0.3–1.2)
Total Protein: 5.6 g/dL — ABNORMAL LOW (ref 6.5–8.1)

## 2020-11-17 LAB — CREATININE, SERUM
Creatinine, Ser: 0.99 mg/dL (ref 0.44–1.00)
GFR, Estimated: >60 mL/min (ref >60)

## 2020-11-17 LAB — CBC
HCT: 29 % — ABNORMAL LOW (ref 36.0–46.0)
HCT: 29.9 % — ABNORMAL LOW (ref 36.0–46.0)
HCT: 26.4 % — ABNORMAL LOW (ref 36.0–46.0)
HCT: 26 % — ABNORMAL LOW (ref 36.0–46.0)
Hemoglobin: 9.2 g/dL — ABNORMAL LOW (ref 12.0–15.0)
Hemoglobin: 9.6 g/dL — ABNORMAL LOW (ref 12.0–15.0)
Hemoglobin: 8.4 g/dL — ABNORMAL LOW (ref 12.0–15.0)
Hemoglobin: 8.5 g/dL — ABNORMAL LOW (ref 12.0–15.0)
MCH: 29.6 pg (ref 26.0–34.0)
MCH: 29.4 pg (ref 26.0–34.0)
MCH: 29.4 pg (ref 26.0–34.0)
MCH: 30.2 pg (ref 26.0–34.0)
MCHC: 31.7 g/dL (ref 30.0–36.0)
MCHC: 32.1 g/dL (ref 30.0–36.0)
MCHC: 31.8 g/dL (ref 30.0–36.0)
MCHC: 32.7 g/dL (ref 30.0–36.0)
MCV: 93.2 fL (ref 80.0–100.0)
MCV: 91.4 fL (ref 80.0–100.0)
MCV: 92.3 fL (ref 80.0–100.0)
MCV: 92.5 fL (ref 80.0–100.0)
Platelets: 137 10*3/uL — ABNORMAL LOW (ref 150–400)
Platelets: 133 10*3/uL — ABNORMAL LOW (ref 150–400)
Platelets: 126 10*3/uL — ABNORMAL LOW (ref 150–400)
Platelets: 141 10*3/uL — ABNORMAL LOW (ref 150–400)
RBC: 3.11 MIL/uL — ABNORMAL LOW (ref 3.87–5.11)
RBC: 3.27 MIL/uL — ABNORMAL LOW (ref 3.87–5.11)
RBC: 2.86 MIL/uL — ABNORMAL LOW (ref 3.87–5.11)
RBC: 2.81 MIL/uL — ABNORMAL LOW (ref 3.87–5.11)
RDW: 13 % (ref 11.5–15.5)
RDW: 12.9 % (ref 11.5–15.5)
RDW: 13 % (ref 11.5–15.5)
RDW: 13.1 % (ref 11.5–15.5)
WBC: 6.6 10*3/uL (ref 4.0–10.5)
WBC: 6.5 10*3/uL (ref 4.0–10.5)
WBC: 10.7 10*3/uL — ABNORMAL HIGH (ref 4.0–10.5)
WBC: 11.9 10*3/uL — ABNORMAL HIGH (ref 4.0–10.5)
nRBC: 0 % (ref 0.0–0.2)
nRBC: 0 % (ref 0.0–0.2)
nRBC: 0 % (ref 0.0–0.2)
nRBC: 0 % (ref 0.0–0.2)

## 2020-11-17 LAB — PROTEIN / CREATININE RATIO, URINE
Creatinine, Urine: 16.76 mg/dL
Total Protein, Urine: <6 mg/dL

## 2020-11-17 LAB — TYPE AND SCREEN
ABO/RH(D): O POS
Antibody Screen: NEGATIVE

## 2020-11-17 LAB — RESP PANEL BY RT-PCR (FLU A&B, COVID) ARPGX2
Influenza A by PCR: NEGATIVE
Influenza B by PCR: NEGATIVE
SARS Coronavirus 2 by RT PCR: NEGATIVE

## 2020-11-17 LAB — GLUCOSE, CAPILLARY
Glucose-Capillary: 111 mg/dL — ABNORMAL HIGH (ref 70–99)
Glucose-Capillary: 81 mg/dL (ref 70–99)
Glucose-Capillary: 142 mg/dL — ABNORMAL HIGH (ref 70–99)
Glucose-Capillary: 88 mg/dL (ref 70–99)
Glucose-Capillary: 101 mg/dL — ABNORMAL HIGH (ref 70–99)

## 2020-11-17 LAB — POCT FERN TEST: POCT Fern Test: POSITIVE

## 2020-11-17 LAB — RPR: RPR Ser Ql: NONREACTIVE

## 2020-11-17 SURGERY — Surgical Case
Anesthesia: Epidural

## 2020-11-17 MED ORDER — FENTANYL CITRATE (PF) 100 MCG/2ML IJ SOLN
INTRAMUSCULAR | Status: AC
  Filled 2020-11-17: qty 2

## 2020-11-17 MED ORDER — OXYCODONE-ACETAMINOPHEN 5-325 MG PO TABS: 2.0000 | ORAL_TABLET | ORAL | Status: DC | PRN

## 2020-11-17 MED ORDER — OXYTOCIN-SODIUM CHLORIDE 30-0.9 UT/500ML-% IV SOLN
INTRAVENOUS | Status: DC | PRN
  Administered 2020-11-17: 300 mL via INTRAVENOUS

## 2020-11-17 MED ORDER — FENTANYL CITRATE (PF) 100 MCG/2ML IJ SOLN: 25.0000 ug | INTRAMUSCULAR | Status: DC | PRN

## 2020-11-17 MED ORDER — SODIUM CHLORIDE 0.9 % IV SOLN
INTRAVENOUS | Status: AC
  Filled 2020-11-17: qty 500

## 2020-11-17 MED ORDER — ACETAMINOPHEN 500 MG PO TABS
1000.0000 mg | ORAL_TABLET | Freq: Four times a day (QID) | ORAL | Status: AC
  Administered 2020-11-18 (×2): 1000 mg via ORAL
  Filled 2020-11-17 (×2): qty 2

## 2020-11-17 MED ORDER — AMISULPRIDE (ANTIEMETIC) 5 MG/2ML IV SOLN: 10.0000 mg | Freq: Once | INTRAVENOUS | Status: DC | PRN

## 2020-11-17 MED ORDER — MAGNESIUM SULFATE BOLUS VIA INFUSION
4.0000 g | Freq: Once | INTRAVENOUS | Status: AC
  Administered 2020-11-17: 4 g via INTRAVENOUS
  Filled 2020-11-17: qty 1000

## 2020-11-17 MED ORDER — PHENYLEPHRINE 40 MCG/ML (10ML) SYRINGE FOR IV PUSH (FOR BLOOD PRESSURE SUPPORT): 80.0000 ug | PREFILLED_SYRINGE | INTRAVENOUS | Status: DC | PRN

## 2020-11-17 MED ORDER — LABETALOL HCL 5 MG/ML IV SOLN
INTRAVENOUS | Status: AC
  Filled 2020-11-17: qty 4

## 2020-11-17 MED ORDER — OXYTOCIN-SODIUM CHLORIDE 30-0.9 UT/500ML-% IV SOLN
1.0000 m[IU]/min | INTRAVENOUS | Status: DC
  Administered 2020-11-17: 2 m[IU]/min via INTRAVENOUS

## 2020-11-17 MED ORDER — EPHEDRINE 5 MG/ML INJ: 10.0000 mg | INTRAVENOUS | Status: DC | PRN

## 2020-11-17 MED ORDER — SODIUM CHLORIDE 0.9 % IV SOLN
500.0000 mg | Freq: Once | INTRAVENOUS | Status: AC
  Administered 2020-11-17: 500 mg via INTRAVENOUS

## 2020-11-17 MED ORDER — COCONUT OIL OIL: 1.0000 "application " | TOPICAL_OIL | Status: DC | PRN

## 2020-11-17 MED ORDER — FUROSEMIDE 10 MG/ML IJ SOLN
INTRAMUSCULAR | Status: DC | PRN
  Administered 2020-11-17: 10 mg via INTRAVENOUS

## 2020-11-17 MED ORDER — FUROSEMIDE 10 MG/ML IJ SOLN: INTRAMUSCULAR | Status: DC | PRN

## 2020-11-17 MED ORDER — SOD CITRATE-CITRIC ACID 500-334 MG/5ML PO SOLN: 30.0000 mL | Freq: Once | ORAL | Status: DC

## 2020-11-17 MED ORDER — SERTRALINE HCL 50 MG PO TABS
50.0000 mg | ORAL_TABLET | Freq: Every day | ORAL | Status: DC
  Administered 2020-11-18 – 2020-11-21 (×4): 50 mg via ORAL
  Filled 2020-11-17 (×5): qty 1

## 2020-11-17 MED ORDER — LACTATED RINGERS IV SOLN: INTRAVENOUS | Status: DC

## 2020-11-17 MED ORDER — KETOROLAC TROMETHAMINE 30 MG/ML IJ SOLN
30.0000 mg | Freq: Four times a day (QID) | INTRAMUSCULAR | Status: AC | PRN
  Administered 2020-11-17: 30 mg via INTRAMUSCULAR

## 2020-11-17 MED ORDER — HYDROMORPHONE HCL 1 MG/ML IJ SOLN
1.0000 mg | INTRAMUSCULAR | Status: DC | PRN
Start: 2020-11-17 — End: 2020-11-21

## 2020-11-17 MED ORDER — SCOPOLAMINE 1 MG/3DAYS TD PT72: 1.0000 | MEDICATED_PATCH | Freq: Once | TRANSDERMAL | Status: DC

## 2020-11-17 MED ORDER — PRENATAL MULTIVITAMIN CH
1.0000 | ORAL_TABLET | Freq: Every day | ORAL | Status: DC
  Administered 2020-11-18 – 2020-11-21 (×4): 1 via ORAL
  Filled 2020-11-17 (×4): qty 1

## 2020-11-17 MED ORDER — OXYCODONE-ACETAMINOPHEN 5-325 MG PO TABS
2.0000 | ORAL_TABLET | ORAL | Status: DC | PRN
Start: 2020-11-17 — End: 2020-11-21
  Administered 2020-11-19 – 2020-11-20 (×2): 2 via ORAL
  Filled 2020-11-17 (×2): qty 2

## 2020-11-17 MED ORDER — DIPHENHYDRAMINE HCL 25 MG PO CAPS: 25.0000 mg | ORAL_CAPSULE | Freq: Four times a day (QID) | ORAL | Status: DC | PRN

## 2020-11-17 MED ORDER — NALBUPHINE HCL 10 MG/ML IJ SOLN: 5.0000 mg | Freq: Once | INTRAMUSCULAR | Status: DC | PRN

## 2020-11-17 MED ORDER — IBUPROFEN 800 MG PO TABS
800.0000 mg | ORAL_TABLET | Freq: Three times a day (TID) | ORAL | Status: DC
  Administered 2020-11-19 – 2020-11-21 (×8): 800 mg via ORAL
  Filled 2020-11-17 (×9): qty 1

## 2020-11-17 MED ORDER — ACETAMINOPHEN 10 MG/ML IV SOLN
1000.0000 mg | Freq: Once | INTRAVENOUS | Status: DC | PRN
  Administered 2020-11-17: 1000 mg via INTRAVENOUS

## 2020-11-17 MED ORDER — LIDOCAINE-EPINEPHRINE (PF) 2 %-1:200000 IJ SOLN
INTRAMUSCULAR | Status: DC | PRN
  Administered 2020-11-17: 5 mL via EPIDURAL

## 2020-11-17 MED ORDER — ZOLPIDEM TARTRATE 5 MG PO TABS: 5.0000 mg | ORAL_TABLET | Freq: Every evening | ORAL | Status: DC | PRN

## 2020-11-17 MED ORDER — FENTANYL CITRATE (PF) 100 MCG/2ML IJ SOLN
50.0000 ug | INTRAMUSCULAR | Status: DC | PRN
  Administered 2020-11-17: 50 ug via INTRAVENOUS
  Filled 2020-11-17: qty 2

## 2020-11-17 MED ORDER — SIMETHICONE 80 MG PO CHEW
80.0000 mg | CHEWABLE_TABLET | Freq: Three times a day (TID) | ORAL | Status: DC
  Administered 2020-11-18 – 2020-11-21 (×11): 80 mg via ORAL
  Filled 2020-11-17 (×11): qty 1

## 2020-11-17 MED ORDER — MEPERIDINE HCL 25 MG/ML IJ SOLN
6.2500 mg | INTRAMUSCULAR | Status: DC | PRN
Start: 2020-11-17 — End: 2020-11-17

## 2020-11-17 MED ORDER — DIBUCAINE (PERIANAL) 1 % EX OINT: 1.0000 "application " | TOPICAL_OINTMENT | CUTANEOUS | Status: DC | PRN

## 2020-11-17 MED ORDER — TETANUS-DIPHTH-ACELL PERTUSSIS 5-2.5-18.5 LF-MCG/0.5 IM SUSY: 0.5000 mL | PREFILLED_SYRINGE | Freq: Once | INTRAMUSCULAR | Status: DC

## 2020-11-17 MED ORDER — NALOXONE HCL 4 MG/10ML IJ SOLN
1.0000 ug/kg/h | INTRAVENOUS | Status: DC | PRN
  Filled 2020-11-17: qty 5

## 2020-11-17 MED ORDER — MORPHINE SULFATE (PF) 0.5 MG/ML IJ SOLN
INTRAMUSCULAR | Status: DC | PRN
  Administered 2020-11-17: 3 mg via EPIDURAL

## 2020-11-17 MED ORDER — OXYTOCIN BOLUS FROM INFUSION: 333.0000 mL | Freq: Once | INTRAVENOUS | Status: DC

## 2020-11-17 MED ORDER — DIPHENHYDRAMINE HCL 50 MG/ML IJ SOLN
12.5000 mg | INTRAMUSCULAR | Status: DC | PRN
Start: 2020-11-17 — End: 2020-11-17

## 2020-11-17 MED ORDER — BUPIVACAINE HCL (PF) 0.25 % IJ SOLN
INTRAMUSCULAR | Status: DC | PRN
  Administered 2020-11-17: 8 mL via EPIDURAL

## 2020-11-17 MED ORDER — TERBUTALINE SULFATE 1 MG/ML IJ SOLN
0.2500 mg | Freq: Once | INTRAMUSCULAR | Status: AC | PRN
  Administered 2020-11-17: 0.25 mg via SUBCUTANEOUS
  Filled 2020-11-17: qty 1

## 2020-11-17 MED ORDER — SOD CITRATE-CITRIC ACID 500-334 MG/5ML PO SOLN
30.0000 mL | ORAL | Status: DC | PRN
  Administered 2020-11-17: 30 mL via ORAL
  Filled 2020-11-17: qty 15

## 2020-11-17 MED ORDER — PROMETHAZINE HCL 25 MG/ML IJ SOLN: 6.2500 mg | INTRAMUSCULAR | Status: DC | PRN

## 2020-11-17 MED ORDER — FENTANYL-BUPIVACAINE-NACL 0.5-0.125-0.9 MG/250ML-% EP SOLN
12.0000 mL/h | EPIDURAL | Status: DC | PRN
  Administered 2020-11-17: 14 mL/h via EPIDURAL
  Filled 2020-11-17: qty 250

## 2020-11-17 MED ORDER — OXYCODONE HCL 5 MG PO TABS: 5.0000 mg | ORAL_TABLET | Freq: Once | ORAL | Status: DC | PRN

## 2020-11-17 MED ORDER — LACTATED RINGERS IV SOLN: INTRAVENOUS | Status: DC | PRN

## 2020-11-17 MED ORDER — MAGNESIUM SULFATE 40 GM/1000ML IV SOLN
INTRAVENOUS | Status: AC
  Filled 2020-11-17: qty 1000

## 2020-11-17 MED ORDER — KETOROLAC TROMETHAMINE 30 MG/ML IJ SOLN: 30.0000 mg | Freq: Four times a day (QID) | INTRAMUSCULAR | Status: AC | PRN

## 2020-11-17 MED ORDER — MAGNESIUM SULFATE 40 GM/1000ML IV SOLN
2.0000 g/h | INTRAVENOUS | Status: AC
  Administered 2020-11-18: 2 g/h via INTRAVENOUS
  Filled 2020-11-17: qty 1000

## 2020-11-17 MED ORDER — ACETAMINOPHEN 10 MG/ML IV SOLN
INTRAVENOUS | Status: AC
  Filled 2020-11-17: qty 100

## 2020-11-17 MED ORDER — KETOROLAC TROMETHAMINE 30 MG/ML IJ SOLN
30.0000 mg | Freq: Four times a day (QID) | INTRAMUSCULAR | Status: AC
  Administered 2020-11-17 – 2020-11-18 (×4): 30 mg via INTRAVENOUS
  Filled 2020-11-17 (×4): qty 1

## 2020-11-17 MED ORDER — OXYTOCIN-SODIUM CHLORIDE 30-0.9 UT/500ML-% IV SOLN: 2.5000 [IU]/h | INTRAVENOUS | Status: AC

## 2020-11-17 MED ORDER — CEFAZOLIN SODIUM-DEXTROSE 2-4 GM/100ML-% IV SOLN
INTRAVENOUS | Status: AC
  Filled 2020-11-17: qty 100

## 2020-11-17 MED ORDER — HYDRALAZINE HCL 20 MG/ML IJ SOLN: 10.0000 mg | INTRAMUSCULAR | Status: DC | PRN

## 2020-11-17 MED ORDER — DIPHENHYDRAMINE HCL 50 MG/ML IJ SOLN: 12.5000 mg | INTRAMUSCULAR | Status: DC | PRN

## 2020-11-17 MED ORDER — SODIUM CHLORIDE 0.9% FLUSH
3.0000 mL | INTRAVENOUS | Status: DC | PRN
  Administered 2020-11-20: 3 mL via INTRAVENOUS

## 2020-11-17 MED ORDER — NALBUPHINE HCL 10 MG/ML IJ SOLN: 5.0000 mg | INTRAMUSCULAR | Status: DC | PRN

## 2020-11-17 MED ORDER — LABETALOL HCL 5 MG/ML IV SOLN: 80.0000 mg | INTRAVENOUS | Status: DC | PRN

## 2020-11-17 MED ORDER — CEFAZOLIN SODIUM-DEXTROSE 2-4 GM/100ML-% IV SOLN
2.0000 g | Freq: Once | INTRAVENOUS | Status: AC
  Administered 2020-11-17: 2 g via INTRAVENOUS

## 2020-11-17 MED ORDER — SENNOSIDES-DOCUSATE SODIUM 8.6-50 MG PO TABS
2.0000 | ORAL_TABLET | Freq: Every day | ORAL | Status: DC
  Administered 2020-11-18 – 2020-11-21 (×4): 2 via ORAL
  Filled 2020-11-17 (×4): qty 2

## 2020-11-17 MED ORDER — OXYCODONE-ACETAMINOPHEN 5-325 MG PO TABS
1.0000 | ORAL_TABLET | ORAL | Status: DC | PRN
Start: 2020-11-17 — End: 2020-11-21
  Administered 2020-11-19 – 2020-11-20 (×2): 1 via ORAL
  Filled 2020-11-17 (×2): qty 1

## 2020-11-17 MED ORDER — ONDANSETRON HCL 4 MG/2ML IJ SOLN
4.0000 mg | Freq: Four times a day (QID) | INTRAMUSCULAR | Status: DC | PRN
  Administered 2020-11-17: 4 mg via INTRAVENOUS
  Filled 2020-11-17: qty 2

## 2020-11-17 MED ORDER — ACETAMINOPHEN 325 MG PO TABS: 650.0000 mg | ORAL_TABLET | ORAL | Status: DC | PRN

## 2020-11-17 MED ORDER — NALOXONE HCL 0.4 MG/ML IJ SOLN: 0.4000 mg | INTRAMUSCULAR | Status: DC | PRN

## 2020-11-17 MED ORDER — MORPHINE SULFATE (PF) 0.5 MG/ML IJ SOLN
INTRAMUSCULAR | Status: AC
  Filled 2020-11-17: qty 10

## 2020-11-17 MED ORDER — ONDANSETRON HCL 4 MG/2ML IJ SOLN: 4.0000 mg | Freq: Three times a day (TID) | INTRAMUSCULAR | Status: DC | PRN

## 2020-11-17 MED ORDER — WITCH HAZEL-GLYCERIN EX PADS
1.0000 "application " | MEDICATED_PAD | CUTANEOUS | Status: DC | PRN
  Filled 2020-11-17: qty 100

## 2020-11-17 MED ORDER — LIDOCAINE HCL (PF) 1 % IJ SOLN
INTRAMUSCULAR | Status: DC | PRN
  Administered 2020-11-17: 10 mL via EPIDURAL

## 2020-11-17 MED ORDER — OXYCODONE-ACETAMINOPHEN 5-325 MG PO TABS: 1.0000 | ORAL_TABLET | ORAL | Status: DC | PRN

## 2020-11-17 MED ORDER — ONDANSETRON HCL 4 MG/2ML IJ SOLN
INTRAMUSCULAR | Status: DC | PRN
  Administered 2020-11-17: 4 mg via INTRAVENOUS

## 2020-11-17 MED ORDER — LIDOCAINE HCL (PF) 1 % IJ SOLN: 30.0000 mL | INTRAMUSCULAR | Status: DC | PRN

## 2020-11-17 MED ORDER — MENTHOL 3 MG MT LOZG: 1.0000 | LOZENGE | OROMUCOSAL | Status: DC | PRN

## 2020-11-17 MED ORDER — OXYCODONE HCL 5 MG/5ML PO SOLN: 5.0000 mg | Freq: Once | ORAL | Status: DC | PRN

## 2020-11-17 MED ORDER — PHENYLEPHRINE 40 MCG/ML (10ML) SYRINGE FOR IV PUSH (FOR BLOOD PRESSURE SUPPORT)
80.0000 ug | PREFILLED_SYRINGE | INTRAVENOUS | Status: DC | PRN
  Filled 2020-11-17: qty 10

## 2020-11-17 MED ORDER — LACTATED RINGERS IV SOLN: 500.0000 mL | INTRAVENOUS | Status: DC | PRN

## 2020-11-17 MED ORDER — ENOXAPARIN SODIUM 40 MG/0.4ML ~~LOC~~ SOLN
40.0000 mg | SUBCUTANEOUS | Status: DC
  Administered 2020-11-18 – 2020-11-21 (×4): 40 mg via SUBCUTANEOUS
  Filled 2020-11-17 (×5): qty 0.4

## 2020-11-17 MED ORDER — SIMETHICONE 80 MG PO CHEW: 80.0000 mg | CHEWABLE_TABLET | ORAL | Status: DC | PRN

## 2020-11-17 MED ORDER — LACTATED RINGERS IV SOLN
500.0000 mL | Freq: Once | INTRAVENOUS | Status: AC
  Administered 2020-11-17: 500 mL via INTRAVENOUS

## 2020-11-17 MED ORDER — ONDANSETRON HCL 4 MG/2ML IJ SOLN
INTRAMUSCULAR | Status: AC
  Filled 2020-11-17: qty 2

## 2020-11-17 MED ORDER — PHENYLEPHRINE 40 MCG/ML (10ML) SYRINGE FOR IV PUSH (FOR BLOOD PRESSURE SUPPORT)
PREFILLED_SYRINGE | INTRAVENOUS | Status: AC
  Filled 2020-11-17: qty 20

## 2020-11-17 MED ORDER — FLEET ENEMA 7-19 GM/118ML RE ENEM: 1.0000 | ENEMA | Freq: Every day | RECTAL | Status: DC | PRN

## 2020-11-17 MED ORDER — FENTANYL CITRATE (PF) 100 MCG/2ML IJ SOLN
INTRAMUSCULAR | Status: DC | PRN
  Administered 2020-11-17: 100 ug via EPIDURAL

## 2020-11-17 MED ORDER — LABETALOL HCL 5 MG/ML IV SOLN
20.0000 mg | INTRAVENOUS | Status: DC | PRN
  Administered 2020-11-17: 20 mg via INTRAVENOUS

## 2020-11-17 MED ORDER — DIPHENHYDRAMINE HCL 25 MG PO CAPS: 25.0000 mg | ORAL_CAPSULE | ORAL | Status: DC | PRN

## 2020-11-17 MED ORDER — OXYTOCIN-SODIUM CHLORIDE 30-0.9 UT/500ML-% IV SOLN
INTRAVENOUS | Status: AC
  Filled 2020-11-17: qty 500

## 2020-11-17 MED ORDER — OXYTOCIN-SODIUM CHLORIDE 30-0.9 UT/500ML-% IV SOLN
2.5000 [IU]/h | INTRAVENOUS | Status: DC
  Filled 2020-11-17: qty 500

## 2020-11-17 MED ORDER — LABETALOL HCL 5 MG/ML IV SOLN
40.0000 mg | INTRAVENOUS | Status: DC | PRN
  Administered 2020-11-17: 40 mg via INTRAVENOUS
  Filled 2020-11-17: qty 8

## 2020-11-17 SURGICAL SUPPLY — 41 items
BENZOIN TINCTURE PRP APPL 2/3 (GAUZE/BANDAGES/DRESSINGS) ×2 IMPLANT
CHLORAPREP W/TINT 26ML (MISCELLANEOUS) ×2 IMPLANT
CLAMP CORD UMBIL (MISCELLANEOUS) IMPLANT
CLOTH BEACON ORANGE TIMEOUT ST (SAFETY) ×2 IMPLANT
DERMABOND ADVANCED (GAUZE/BANDAGES/DRESSINGS) ×1
DERMABOND ADVANCED .7 DNX12 (GAUZE/BANDAGES/DRESSINGS) ×1 IMPLANT
DRAPE C SECTION CLR SCREEN (DRAPES) IMPLANT
DRSG OPSITE POSTOP 4X10 (GAUZE/BANDAGES/DRESSINGS) ×2 IMPLANT
ELECT REM PT RETURN 9FT ADLT (ELECTROSURGICAL) ×2
ELECTRODE REM PT RTRN 9FT ADLT (ELECTROSURGICAL) ×1 IMPLANT
EXTRACTOR VACUUM M CUP 4 TUBE (SUCTIONS) IMPLANT
GLOVE BIO SURGEON STRL SZ7.5 (GLOVE) ×2 IMPLANT
GLOVE BIOGEL PI IND STRL 7.0 (GLOVE) ×1 IMPLANT
GLOVE BIOGEL PI INDICATOR 7.0 (GLOVE) ×1
GOWN STRL REUS W/TWL 2XL LVL3 (GOWN DISPOSABLE) ×2 IMPLANT
GOWN STRL REUS W/TWL LRG LVL3 (GOWN DISPOSABLE) ×4 IMPLANT
HOVERMATT SINGLE USE (MISCELLANEOUS) ×2 IMPLANT
KIT ABG SYR 3ML LUER SLIP (SYRINGE) IMPLANT
NEEDLE HYPO 22GX1.5 SAFETY (NEEDLE) ×2 IMPLANT
NEEDLE HYPO 25X5/8 SAFETYGLIDE (NEEDLE) IMPLANT
NS IRRIG 1000ML POUR BTL (IV SOLUTION) ×2 IMPLANT
PACK C SECTION WH (CUSTOM PROCEDURE TRAY) ×2 IMPLANT
PAD OB MATERNITY 4.3X12.25 (PERSONAL CARE ITEMS) ×2 IMPLANT
PENCIL SMOKE EVAC W/HOLSTER (ELECTROSURGICAL) ×2 IMPLANT
RTRCTR C-SECT PINK 25CM LRG (MISCELLANEOUS) ×2 IMPLANT
STRIP CLOSURE SKIN 1/2X4 (GAUZE/BANDAGES/DRESSINGS) ×2 IMPLANT
SUT CHROMIC 1 CTX 36 (SUTURE) ×4 IMPLANT
SUT PLAIN 0 NONE (SUTURE) IMPLANT
SUT VIC AB 1 CT1 36 (SUTURE) ×4 IMPLANT
SUT VIC AB 2-0 CT1 (SUTURE) ×2 IMPLANT
SUT VIC AB 2-0 CT1 27 (SUTURE) ×1
SUT VIC AB 2-0 CT1 TAPERPNT 27 (SUTURE) ×1 IMPLANT
SUT VIC AB 3-0 CT1 27 (SUTURE) ×1
SUT VIC AB 3-0 CT1 TAPERPNT 27 (SUTURE) ×1 IMPLANT
SUT VIC AB 3-0 SH 27 (SUTURE)
SUT VIC AB 3-0 SH 27X BRD (SUTURE) IMPLANT
SUT VIC AB 4-0 KS 27 (SUTURE) ×2 IMPLANT
SYR BULB IRRIGATION 50ML (SYRINGE) IMPLANT
TOWEL OR 17X24 6PK STRL BLUE (TOWEL DISPOSABLE) ×2 IMPLANT
TRAY FOLEY W/BAG SLVR 14FR LF (SET/KITS/TRAYS/PACK) ×2 IMPLANT
WATER STERILE IRR 1000ML POUR (IV SOLUTION) ×2 IMPLANT

## 2020-11-17 NOTE — Lactation Note (Signed)
This note was copied from a baby's chart. Lactation Consultation Note  Patient Name: Deborah Schmidt [id number] Date: 11/17/2020 Reason for consult: Initial assessment;Term;1st time breastfeeding;Hyperbilirubinemia (C/S delivery, on phototherapy, hypoglycemia) Age:22 hours P1 term female infant on phototherapy with low blood sugar. Mom's current feeding choice is breastfeeding and supplementing with formula MBU informed LC of mom's plan and LEAD has been discussed. Per mom, infant has breast feed 3 times since delivery and recently breastfeed for 15 minutes 30 minutes prior to [facility] entering the room, MBU (RN) assisted with latch. Infant was in basinet, LC discussed hand expression, mom easily expressed 8 mls of colostrum that was spoon fed to infant.  Mom was using the DEBP as LC left the room, mom knows to pump every 3 hours for 15 minutes on initial setting, giving infant back any EBM that is pumped before supplementing with formula.  Mom shown how to use DEBP & how to disassemble, clean, & reassemble parts. Mom knows to breastfeed infant according to cues, 8 to 12 or more times within 24 hours, STS. Afterwards mom will supplement infant with her EBM first that has been pumped or hand expressed and then offer formula. Mom knows to call RN or LC if she needs assistance with latching infant at the breast. LC discussed infant's input and output with parents. Mom made aware of O/P services, breastfeeding support groups, community resources, and our phone # for post-discharge questions.  Maternal Data Has patient been taught Hand Expression?: Yes Does the patient have breastfeeding experience prior to this delivery?: No  Feeding Mother's Current Feeding Choice: Breast Milk and Formula  LATCH Score Latch: Grasps breast easily, tongue down, lips flanged, rhythmical sucking.  Audible Swallowing: A few with stimulation  Type of Nipple: Everted at rest and after stimulation  Comfort (Breast/Nipple):  Soft / non-tender  Hold (Positioning): Assistance needed to correctly position infant at breast and maintain latch.  LATCH Score: 8   Lactation Tools Discussed/Used Tools: Pump Breast pump type: Double-Electric Breast Pump Pump Education: Setup, frequency, and cleaning;Milk Storage Reason for Pumping: To help stimulate milk production and establish milk supply, infant on phottherapy Pumping frequency: Mom understands to pump every 3 hours for 15 minutes on inital setting.  Interventions Interventions: Breast feeding basics reviewed;Skin to skin;Hand express;Expressed milk;Position options;DEBP;Education  Discharge Pump: Personal;DEBP WIC Program: Yes  Consult Status Consult Status: Follow-up Date: 11/18/20 Follow-up type: In-patient    [name] 11/17/2020, 11:12 PM

## 2020-11-17 NOTE — Transfer of Care (Signed)
Immediate Anesthesia Transfer of Care Note  Patient: Deborah Schmidt  Procedure(s) Performed: CESAREAN SECTION (N/A )  Patient Location: PACU  Anesthesia Type:Epidural  Level of Consciousness: awake, alert  and oriented  Airway & Oxygen Therapy: Patient Spontanous Breathing  Post-op Assessment: Report given to RN and Post -op Vital signs reviewed and stable  Post vital signs: Reviewed and stable  Last Vitals:  Vitals Value Taken Time  BP 108/72 11/17/20 1945  Temp 37.4 C 11/17/20 1940  Pulse 107 11/17/20 1946  Resp 27 11/17/20 1946  SpO2 98 % 11/17/20 1946  Vitals shown include unvalidated device data.  Last Pain:  Vitals:   11/17/20 1940  TempSrc: Oral  PainSc:          Complications: No complications documented.

## 2020-11-17 NOTE — Progress Notes (Signed)
Patient ID: Deborah Schmidt, female   DOB: 05-08-2001, 20 y.o.   MRN: [id number] Pt has made littler cervical change over the last several hours. Unable to obtain an  adequate labor pattern due to fetal intolerance of labor. Intermittent variables and occ late deceleration. Discussed management options with pt. Pt desires to proceed with c section. R/B/Post op care reviewed with pt Pt verbalized understanding and desires to proceed. Nursing, OR and anesthesia notified. Will proceed to OR when ready.

## 2020-11-17 NOTE — Anesthesia Postprocedure Evaluation (Signed)
Anesthesia Post Note  Patient: Deborah Schmidt  Procedure(s) Performed: CESAREAN SECTION (N/A )     Patient location during evaluation: Mother Baby Anesthesia Type: Epidural Level of consciousness: awake and alert Pain management: pain level controlled Vital Signs Assessment: post-procedure vital signs reviewed and stable Respiratory status: spontaneous breathing, nonlabored ventilation and respiratory function stable Cardiovascular status: stable Postop Assessment: no headache, no backache and epidural receding Anesthetic complications: no   No complications documented.  Last Vitals:  Vitals:   11/17/20 2000 11/17/20 2015  BP: (!) 117/55 115/68  Pulse: (!) 103 (!) 103  Resp: 15 (!) 21  Temp:  37 C  SpO2: 99% 98%    Last Pain:  Vitals:   11/17/20 2015  TempSrc: Oral  PainSc: 0-No pain   Pain Goal:    LLE Motor Response: No movement due to regional block (11/17/20 2000) LLE Sensation: Decreased (11/17/20 2000) RLE Motor Response: No movement due to regional block (11/17/20 2000) RLE Sensation: Decreased (11/17/20 2000)     Epidural/Spinal Function Cutaneous sensation: Vague (11/17/20 2000), Patient able to flex knees: No (11/17/20 2000), Patient able to lift hips off bed: No (11/17/20 2000), Back pain beyond tenderness at insertion site: No (11/17/20 2000), Progressively worsening motor and/or sensory loss: No (11/17/20 2000), Bowel and/or bladder incontinence post epidural: No (11/17/20 2000)  Mellody Dance

## 2020-11-17 NOTE — Progress Notes (Signed)
Deborah Schmidt is a 20 y.o. G1P0 at [redacted]w[redacted]d.  Subjective: Comfortable w/ epidural.  Denies HA, vision changes or epigastric pain.  Objective: BP 125/73   Pulse 68   Temp 98.2 F (36.8 C) (Oral)   Resp 18   Ht 5' (1.524 m)   Wt 88.9 kg   LMP 02/20/2020   SpO2 99%   BMI 38.28 kg/m   Patient Vitals for the past 24 hrs:  BP Temp Temp src Pulse Resp SpO2 Height Weight  11/17/20 1031 125/73 -- -- 68 18 -- -- --  11/17/20 1003 139/80 98.2 F (36.8 C) Oral 70 16 -- -- --  11/17/20 0930 122/66 -- -- 71 17 -- -- --  11/17/20 0912 (!) 170/96 -- -- (!) 104 20 -- -- --  11/17/20 0906 (!) 136/99 -- -- 78 16 -- -- --  11/17/20 0901 (!) 138/93 -- -- 98 17 -- -- --  11/17/20 0856 (!) 146/84 -- -- 74 16 -- -- --  11/17/20 0852 (!) 163/90 -- -- 73 17 -- -- --  11/17/20 0845 (!) 159/87 -- -- 73 19 -- -- --  11/17/20 0840 (!) 146/84 -- -- 99 17 -- -- --  11/17/20 0807 140/76 97.9 F (36.6 C) Axillary 73 16 -- -- --  11/17/20 0614 -- -- -- -- -- -- 5' (1.524 m) 88.9 kg  11/17/20 0601 133/72 -- -- 81 -- -- -- --  11/17/20 0551 133/67 -- -- 69 -- -- -- --  11/17/20 0516 121/75 -- -- 72 -- -- -- --  11/17/20 0254 (!) 157/91 -- Oral 87 18 99 % 5' (1.524 m) 89 kg     FHT:  FHR: 140 bpm, variability: mod,  accelerations:  15x15,  decelerations:  none UC:   Q 3 minutes, strong Dilation: 6 Effacement (%): 100 Station: -2,-3 Presentation: Vertex Exam by:: Delrae Sawyers RN  Labs: Results for orders placed or performed during the hospital encounter of 11/17/20 (from the past 24 hour(s))  Fern Test     Status: Abnormal   Collection Time: 11/17/20  3:25 AM  Result Value Ref Range   POCT Fern Test Positive = ruptured amniotic membanes   Resp Panel by RT-PCR (Flu A&B, Covid) Nasopharyngeal Swab     Status: None   Collection Time: 11/17/20  3:48 AM   Specimen: Nasopharyngeal Swab; Nasopharyngeal(NP) swabs in vial transport medium  Result Value Ref Range   SARS Coronavirus 2 by RT PCR NEGATIVE NEGATIVE    Influenza A by PCR NEGATIVE NEGATIVE   Influenza B by PCR NEGATIVE NEGATIVE  CBC     Status: Abnormal   Collection Time: 11/17/20  3:48 AM  Result Value Ref Range   WBC 6.6 4.0 - 10.5 K/uL   RBC 3.11 (L) 3.87 - 5.11 MIL/uL   Hemoglobin 9.2 (L) 12.0 - 15.0 g/dL   HCT 86.7 (L) 61.9 - 50.9 %   MCV 93.2 80.0 - 100.0 fL   MCH 29.6 26.0 - 34.0 pg   MCHC 31.7 30.0 - 36.0 g/dL   RDW 32.6 71.2 - 45.8 %   Platelets 137 (L) 150 - 400 K/uL   nRBC 0.0 0.0 - 0.2 %  Type and screen [facility]     Status: None   Collection Time: 11/17/20  4:15 AM  Result Value Ref Range   ABO/RH(D) O POS    Antibody Screen NEG    Sample Expiration      11/20/2020,2359 Performed at [facility]  Hospital Lab, [address] [place], [place] [address]   Glucose, capillary     Status: Abnormal   Collection Time: 11/17/20  6:47 AM  Result Value Ref Range   Glucose-Capillary 111 (H) 70 - 99 mg/dL  Glucose, capillary     Status: None   Collection Time: 11/17/20 10:01 AM  Result Value Ref Range   Glucose-Capillary 81 70 - 99 mg/dL    Assessment / Plan: [redacted]w[redacted]d week IUP Labor: Active Fetal Wellbeing:  Category I Pain Control:  Epidural Anticipated MOD:  SVD Elevated BP: Now w/ 9 elevated BP's including 2 severe-range that occurred during epidural placement and were not severe-range on recheck. Will hold BP meds at this time, check Pre-E labs and consider BP meds in labor for persistent severe-range BPs. No Pre-E Sx.   Katrinka Blazing, [place], CNM 11/17/2020 10:41 AM

## 2020-11-17 NOTE — Progress Notes (Addendum)
Labor Progress Note Deborah Schmidt is a 20 y.o. G1P0 at [redacted]w[redacted]d presented for SROM. S: Comfortable with Epidural.  O:  BP 139/78   Pulse 92   Temp 98.7 F (37.1 C) (Oral)   Resp 20   Ht 5' (1.524 m)   Wt 88.9 kg   LMP 02/20/2020   SpO2 99%   BMI 38.28 kg/m  EFM: 140/ - Accelerations/ + Late decelerations  CVE: Dilation: 6 Effacement (%): 80 Station: -1 Presentation: Vertex Exam by:: S Aurélie.Currier RN   A&P: 20 y.o. G1P0 [redacted]w[redacted]d plan for SVD. #Labor: Progressing well. Late Decelerations.  Stopping Pitocin #Pain: Epidural #FWB: Category 1 #GBS negative #A1GDM: Nml CBGs  #GHTN: Nml Pre-E labs except for mildly plts. Does not meet criteria for Pre-E. Recheck after delivery or sooner as needed.   Jovita Kussmaul, MD 5:16 PM  I was consulted RE: the exam and agree with above except that FHR is category II. Has had intermittent episodes of late and variable decels w/ moderate variability over the past 3 hours that haven't resolved w/ decreasing and stopping pitocin, fluid boluses, multiple position changes and terbutaline. Cervix has has made minimal cervical change over 8 hours. Discussed w/ Dr. Alysia Penna. At [facility] discussing POC and recommending C/S. Pt consents. See Dr. Waynard Edwards note.    Katrinka Blazing, [place], [place] 11/17/2020 5:53 PM

## 2020-11-17 NOTE — Discharge Instructions (Signed)
Cesarean Delivery, Care After This sheet gives you information about how to care for yourself after your procedure. Your health care provider may also give you more specific instructions. If you have problems or questions, contact your health care provider. What can I expect after the procedure? After the procedure, it is common to have:  A small amount of blood or clear fluid coming from the incision.  Some redness, swelling, and pain in your incision area.  Some abdominal pain and soreness.  Vaginal bleeding (lochia). Even though you did not have a vaginal delivery, you will still have vaginal bleeding and discharge.  Pelvic cramps.  Fatigue. You may have pain, swelling, and discomfort in the tissue between your vagina and your anus (perineum) if:  Your C-section was unplanned, and you were allowed to labor and push.  An incision was made in the area (episiotomy) or the tissue tore during attempted vaginal delivery. Follow these instructions at home: Incision care  Follow instructions from your health care provider about how to take care of your incision. Make sure you: ? Wash your hands with soap and water before you change your bandage (dressing). If soap and water are not available, use hand sanitizer. ? If you have a dressing, change it or remove it as told by your health care provider. ? Leave stitches (sutures), skin staples, skin glue, or adhesive strips in place. These skin closures may need to stay in place for 2 weeks or longer. If adhesive strip edges start to loosen and curl up, you may trim the loose edges. Do not remove adhesive strips completely unless your health care provider tells you to do that.  Check your incision area every day for signs of infection. Check for: ? More redness, swelling, or pain. ? More fluid or blood. ? Warmth. ? Pus or a bad smell.  Do not take baths, swim, or use a hot tub until your health care provider says it's okay. Ask your health  care provider if you can take showers.  When you cough or sneeze, hug a pillow. This helps with pain and decreases the chance of your incision opening up (dehiscing). Do this until your incision heals.   Medicines  Take over-the-counter and prescription medicines only as told by your health care provider.  If you were prescribed an antibiotic medicine, take it as told by your health care provider. Do not stop taking the antibiotic even if you start to feel better.  Do not drive or use heavy machinery while taking prescription pain medicine. Lifestyle  Do not drink alcohol. This is especially important if you are breastfeeding or taking pain medicine.  Do not use any products that contain nicotine or tobacco, such as cigarettes, e-cigarettes, and chewing tobacco. If you need help quitting, ask your health care provider. Eating and drinking  Drink at least 8 eight-ounce glasses of water every day unless told not to by your health care provider. If you breastfeed, you may need to drink even more water.  Eat high-fiber foods every day. These foods may help prevent or relieve constipation. High-fiber foods include: ? Whole grain cereals and breads. ? Brown rice. ? Beans. ? Fresh fruits and vegetables. Activity  If possible, have someone help you care for your baby and help with household activities for at least a few days after you leave the hospital.  Return to your normal activities as told by your health care provider. Ask your health care provider what activities are safe for   you.  Rest as much as possible. Try to rest or take a nap while your baby is sleeping.  Do not lift anything that is heavier than 10 lbs (4.5 kg), or the limit that you were told, until your health care provider says that it is safe.  Talk with your health care provider about when you can engage in sexual activity. This may depend on your: ? Risk of infection. ? How fast you heal. ? Comfort and desire to  engage in sexual activity.   General instructions  Do not use tampons or douches until your health care provider approves.  Wear loose, comfortable clothing and a supportive and well-fitting bra.  Keep your perineum clean and dry. Wipe from front to back when you use the toilet.  If you pass a blood clot, save it and call your health care provider to discuss. Do not flush blood clots down the toilet before you get instructions from your health care provider.  Keep all follow-up visits for you and your baby as told by your health care provider. This is important. Contact a health care provider if:  You have: ? A fever. ? Bad-smelling vaginal discharge. ? Pus or a bad smell coming from your incision. ? Difficulty or pain when urinating. ? A sudden increase or decrease in the frequency of your bowel movements. ? More redness, swelling, or pain around your incision. ? More fluid or blood coming from your incision. ? A rash. ? Nausea. ? Little or no interest in activities you used to enjoy. ? Questions about caring for yourself or your baby.  Your incision feels warm to the touch.  Your breasts turn red or become painful or hard.  You feel unusually sad or worried.  You vomit.  You pass a blood clot from your vagina.  You urinate more than usual.  You are dizzy or light-headed. Get help right away if:  You have: ? Pain that does not go away or get better with medicine. ? Chest pain. ? Difficulty breathing. ? Blurred vision or spots in your vision. ? Thoughts about hurting yourself or your baby. ? New pain in your abdomen or in one of your legs. ? A severe headache.  You faint.  You bleed from your vagina so much that you fill more than one sanitary pad in one hour. Bleeding should not be heavier than your heaviest period. Summary  After the procedure, it is common to have pain at your incision site, abdominal cramping, and slight bleeding from your vagina.  Check  your incision area every day for signs of infection.  Tell your health care provider about any unusual symptoms.  Keep all follow-up visits for you and your baby as told by your health care provider. This information is not intended to replace advice given to you by your health care provider. Make sure you discuss any questions you have with your health care provider. Document Revised: 02/09/2018 Document Reviewed: 02/09/2018 Elsevier Patient Education  2021 [employer]  

## 2020-11-17 NOTE — Op Note (Signed)
Deborah Schmidt PROCEDURE DATE: 11/17/2020  PREOPERATIVE DIAGNOSES: Intrauterine pregnancy at [redacted]w[redacted]d weeks gestation; failure to progress: arrest of descent, fetal intolerance of labor and severe PEC  POSTOPERATIVE DIAGNOSES: The same  PROCEDURE: Primary Low Transverse Cesarean Section  SURGEON:  Dr. Nettie Elm, MD  ASSISTANT:NA  ANESTHESIOLOGY TEAM: Anesthesiologist: Mellody Dance, MD CRNA: Elbert Ewings, CRNA; Angela Adam, CRNA  INDICATIONS: Deborah Schmidt is a 20 y.o. G1P1001 at [redacted]w[redacted]d here for cesarean section secondary to the indications listed under preoperative diagnoses; please see preoperative note for further details.  The risks of surgery were discussed with the patient including but were not limited to: bleeding which may require transfusion or reoperation; infection which may require antibiotics; injury to bowel, bladder, ureters or other surrounding organs; injury to the fetus; need for additional procedures including hysterectomy in the event of a life-threatening hemorrhage; formation of adhesions; placental abnormalities wth subsequent pregnancies; incisional problems; thromboembolic phenomenon and other postoperative/anesthesia complications.  The patient concurred with the proposed plan, giving informed written consent for the procedure.    FINDINGS:  Viable female infant in cephalic presentation.  Apgars and weight as recorded. Clear amniotic fluid.  Intact placenta, three vessel cord.  Normal uterus, fallopian tubes and ovaries bilaterally.  ANESTHESIA: Epidural  INTRAVENOUS FLUIDS: As recorded ESTIMATED BLOOD LOSS: 975 ml URINE OUTPUT:  As recorded SPECIMENS: Placenta sent to L&D COMPLICATIONS: None immediate  PROCEDURE IN DETAIL:  The patient preoperatively received intravenous antibiotics and had sequential compression devices applied to her lower extremities.  She was then taken to the operating room where the epidural anesthesia was dosed up to surgical level and  was found to be adequate. She was then placed in a dorsal supine position with a leftward tilt, and prepped and draped in a sterile manner.  A foley catheter was placed into her bladder and attached to constant gravity.  After an adequate timeout was performed, a Pfannenstiel skin incision was made with scalpel and carried through to the underlying layer of fascia. The fascia was incised in the midline, and this incision was extended bilaterally using the Mayo scissors.  Kocher clamps were applied to the superior aspect of the fascial incision and the underlying rectus muscles were dissected off bluntly and sharply.  A similar process was carried out on the inferior aspect of the fascial incision. The rectus muscles were separated in the midline and the peritoneum was entered bluntly. The Alexis self-retaining retractor was introduced into the abdominal cavity.  Attention was turned to the lower uterine segment where a low transverse hysterotomy was made with a scalpel and extended bilaterally bluntly.  The infant was successfully delivered, the cord was clamped and cut after one minute, and the infant was handed over to the awaiting neonatology team. Uterine massage was then administered, and the placenta delivered intact with a three-vessel cord. The uterus was then cleared of clots and debris.  The hysterotomy was closed with 0 Vicryl in a running locked fashion, and an imbricating layer was also placed with 0 Vicryl.   The pelvis was cleared of all clot and debris. Hemostasis was confirmed on all surfaces.  The retractor was removed.  The peritoneum and rectus muscles were closed with a 2/0 Vicryl. The fascia was then closed using 0 Vicryl in a running fashion.  The subcutaneous layer was irrigated, reapproximated with 3/0 Vicryl, and the skin was closed with a 4-0 Vicryl subcuticular stitch. The patient tolerated the procedure well. Sponge, instrument and needle counts were  correct x 3.  She was taken to the  recovery room in stable condition.     Due to the Dx of SPEC pt was started on Magnesium in PACU.  Nettie Elm, MD, FACOG Obstetrician & Gynecologist, [facility] for [facility], [facility]

## 2020-11-17 NOTE — Anesthesia Procedure Notes (Signed)
Epidural Patient location during procedure: OB Start time: 11/17/2020 8:35 AM End time: 11/17/2020 8:45 AM  Staffing Anesthesiologist: Mellody Dance, MD Performed: anesthesiologist   Preanesthetic Checklist Completed: patient identified, IV checked, site marked, risks and benefits discussed, monitors and equipment checked, pre-op evaluation and timeout performed  Epidural Patient position: sitting Prep: DuraPrep Patient monitoring: heart rate, cardiac monitor, continuous pulse ox and blood pressure Approach: midline Location: L2-L3 Injection technique: LOR saline  Needle:  Needle type: Tuohy  Needle gauge: 17 G Needle length: 9 cm Needle insertion depth: 7 cm Catheter type: closed end flexible Catheter size: 20 Guage Catheter at skin depth: 12 cm Test dose: negative and Other  Assessment Events: blood not aspirated, injection not painful, no injection resistance and negative IV test  Additional Notes Informed consent obtained prior to proceeding including risk of failure, 1% risk of PDPH, risk of minor discomfort and bruising.  Discussed rare but serious complications including epidural abscess, permanent nerve injury, epidural hematoma.  Discussed alternatives to epidural analgesia and patient desires to proceed.  Timeout performed pre-procedure verifying patient name, procedure, and platelet count.  Patient tolerated procedure well.

## 2020-11-17 NOTE — H&P (Addendum)
OBSTETRIC ADMISSION HISTORY AND PHYSICAL  Deborah Schmidt is a 20 y.o. female G1P0 with IUP at [redacted]w[redacted]d by lmp presenting for rupture of membranes. She reports gush of fluid at 2000 and onset of contractions shortly thereafter. She reports +FMs, no VB, no blurry vision, headaches or peripheral edema, and RUQ pain.  She plans on breast feeding. She request POP's for birth control. She received her prenatal [facility] at [facility]   Dating: By LMP --->  Estimated Date of Delivery: 11/21/20  Sono:    @[redacted]w[redacted]d , CWD, normal anatomy, cephalic presentation, 3730g, EFW   Prenatal History/Complications:  -Chlamydia + on 3/14, took azithromycin  -a1gdm -Hx of depresssion  -Anemia   Past Medical History: Past Medical History:  Diagnosis Date  . Anemia   . Anxiety   . Depression   . Gastritis   . Gestational diabetes   . Mental disorder     Past Surgical History: History reviewed. No pertinent surgical history.  Obstetrical History: OB History    Gravida  1   Para      Term      Preterm      AB      Living        SAB      IAB      Ectopic      Multiple      Live Births              Social History Social History   Socioeconomic History  . Marital status: Single    Spouse name: Not on file  . Number of children: Not on file  . Years of education: Not on file  . Highest education level: Not on file  Occupational History  . Not on file  Tobacco Use  . Smoking status: Never Smoker  . Smokeless tobacco: Never Used  Substance and Sexual Activity  . Alcohol use: Not Currently    Comment: social  . Drug use: Not Currently    Types: Marijuana  . Sexual activity: Yes  Other Topics Concern  . Not on file  Social History Narrative  . Not on file   Social Determinants of Health   Financial Resource Strain: Not on file  Food Insecurity: Not on file  Transportation Needs: Not on file  Physical Activity: Not on file  Stress: Not on file  Social Connections: Not on file     Family History: Family History  Family history unknown: Yes    Allergies: No Known Allergies  Medications Prior to Admission  Medication Sig Dispense Refill Last Dose  . Prenatal Vit-Fe Fumarate-FA (PRENATAL COMPLETE) 14-0.4 MG TABS Take 1 tablet by mouth daily. 60 tablet 0 11/17/2020 at Unknown time  . sertraline (ZOLOFT) 50 MG tablet Take 50 mg by mouth daily.   11/17/2020 at Unknown time  . doxylamine, Sleep, (UNISOM) 25 MG tablet Take 0.5 tablets (12.5 mg total) by mouth 3 (three) times daily as needed. 30 tablet 0   . ESTARYLLA 0.25-35 MG-MCG tablet Take 1 tablet by mouth daily.   4   . ibuprofen (ADVIL,MOTRIN) 200 MG tablet Take 200 mg by mouth every 6 (six) hours as needed for moderate pain.     05-03-2001 omeprazole (PRILOSEC) 20 MG capsule Take 1 capsule (20 mg total) by mouth daily. Take one tablet daily 21 capsule 0   . ondansetron (ZOFRAN ODT) 4 MG disintegrating tablet Take 1 tablet (4 mg total) by mouth every 8 (eight) hours as needed for nausea or  vomiting. (Patient not taking: Reported on 06/10/2019) 15 tablet 0   . ranitidine (ZANTAC) 150 MG tablet Take 1 tablet (150 mg total) by mouth 2 (two) times daily. (Patient not taking: Reported on 06/10/2019) 60 tablet 0   . sucralfate (CARAFATE) 1 g tablet Take 1 tablet (1 g total) by mouth 4 (four) times daily -  with meals and at bedtime. 21 tablet 0   . vitamin B-6 (PYRIDOXINE) 25 MG tablet Take 1 tablet (25 mg total) by mouth 3 (three) times daily as needed. 60 tablet 0      Review of Systems   All systems reviewed and negative except as stated in HPI  Blood pressure (!) 157/91, pulse 87, resp. rate 18, height 5' (1.524 m), weight 89 kg, last menstrual period 02/20/2020, SpO2 99 %. General appearance: alert, cooperative and appears stated age Lungs: clear to auscultation bilaterally Abdomen: soft, non-tender; bowel sounds normal  Extremities: Homans sign is negative, no sign of DVT  Presentation: cephalic Fetal  monitoringBaseline: 135 bpm mod var/pos accels/no decels  Uterine activity q3-4 Dilation: 3 Effacement (%): 70 Station: -3 Exam by:: Lestine Box, RN   Prenatal labs: ABO, Rh:   O pos Antibody:  neg Rubella:  immune  RPR:   NR HBsAg:   neg HIV:   neg GBS:   neg 1 hr Glucola abnormal Genetic screening low risk NIPS Anatomy US normal   Prenatal Transfer Tool  Maternal Diabetes: Yes:  Diabetes Type:  Diet controlled Genetic Screening: Normal Maternal Ultrasounds/Referrals: Normal Fetal Ultrasounds or other Referrals:  None Maternal Substance Abuse:  No Significant Maternal Medications:  None Significant Maternal Lab Results: Group B Strep negative  Results for orders placed or performed during the hospital encounter of 11/17/20 (from the past 24 hour(s))  [employer] Time: 11/17/20  3:25 AM  Result Value Ref Range   POCT Fern Test Positive = ruptured amniotic membanes     There are no problems to display for this patient.   Assessment/Plan:  [name] is a 20 y.o. G1P0 at [redacted]w[redacted]d here for [facility] labor.   #Labor: SROM meconium at 2000. Expectant management for now, will augment as needed  #Pain: Per patient request #FWB: Cat I  #ID:  gbs neg  #MOF: breast #MOC:POP's #Circ:  Yes   #Hx Chlamydia on 3/14: reports taking azithro, will order TOC #Normocytic Anemia: admission hgb 9.2 #A1GDM: CBG q4hr, EFW 83%ile at 38 wks, EFW by Leopold's 8.5 lbs    Gita Kudo, MD  11/17/2020, 4:24 AM

## 2020-11-17 NOTE — Progress Notes (Signed)
Deborah Schmidt is a 20 y.o. G1P0 at [redacted]w[redacted]d.  Subjective: Comfortable w/ epidural  Objective: BP (!) 142/79   Pulse 75   Temp 98.2 F (36.8 C) (Oral)   Resp 16   Ht 5' (1.524 m)   Wt 88.9 kg   LMP 02/20/2020   SpO2 99%   BMI 38.28 kg/m    FHT:  FHR: 135 bpm, variability: mod,  accelerations:  15x15,  decelerations:  Few variables UC:   Q 2-6 minutes, mod Dilation: 7 Effacement (%): 100 Station: -1 Presentation: Vertex Exam by:: V Deziya Amero CNM IUPC placed w/out difficulty.  Labs: Results for orders placed or performed during the hospital encounter of 11/17/20 (from the past 24 hour(s))  Fern Test     Status: Abnormal   Collection Time: 11/17/20  3:25 AM  Result Value Ref Range   POCT Fern Test Positive = ruptured amniotic membanes   Resp Panel by RT-PCR (Flu A&B, Covid) Nasopharyngeal Swab     Status: None   Collection Time: 11/17/20  3:48 AM   Specimen: Nasopharyngeal Swab; Nasopharyngeal(NP) swabs in vial transport medium  Result Value Ref Range   SARS Coronavirus 2 by RT PCR NEGATIVE NEGATIVE   Influenza A by PCR NEGATIVE NEGATIVE   Influenza B by PCR NEGATIVE NEGATIVE  CBC     Status: Abnormal   Collection Time: 11/17/20  3:48 AM  Result Value Ref Range   WBC 6.6 4.0 - 10.5 K/uL   RBC 3.11 (L) 3.87 - 5.11 MIL/uL   Hemoglobin 9.2 (L) 12.0 - 15.0 g/dL   HCT 59.5 (L) 63.8 - 75.6 %   MCV 93.2 80.0 - 100.0 fL   MCH 29.6 26.0 - 34.0 pg   MCHC 31.7 30.0 - 36.0 g/dL   RDW 43.3 29.5 - 18.8 %   Platelets 137 (L) 150 - 400 K/uL   nRBC 0.0 0.0 - 0.2 %  RPR     Status: None   Collection Time: 11/17/20  3:48 AM  Result Value Ref Range   RPR Ser Ql NON REACTIVE NON REACTIVE  Type and screen [facility]     Status: None   Collection Time: 11/17/20  4:15 AM  Result Value Ref Range   ABO/RH(D) O POS    Antibody Screen NEG    Sample Expiration      11/20/2020,2359 Performed at [facility], [address], [place], [place] [address]   Glucose,  capillary     Status: Abnormal   Collection Time: 11/17/20  6:47 AM  Result Value Ref Range   Glucose-Capillary 111 (H) 70 - 99 mg/dL  Protein / creatinine ratio, urine     Status: None   Collection Time: 11/17/20  9:29 AM  Result Value Ref Range   Creatinine, Urine 16.76 mg/dL   Total Protein, Urine <6 mg/dL   Protein Creatinine Ratio        0.00 - 0.15 mg/mg[Cre]  Glucose, capillary     Status: None   Collection Time: 11/17/20 10:01 AM  Result Value Ref Range   Glucose-Capillary 81 70 - 99 mg/dL  Comprehensive metabolic panel     Status: Abnormal   Collection Time: 11/17/20 10:23 AM  Result Value Ref Range   Sodium 139 135 - 145 mmol/L   Potassium 3.3 (L) 3.5 - 5.1 mmol/L   Chloride 110 98 - 111 mmol/L   CO2 24 22 - 32 mmol/L   Glucose, Bld 87 70 - 99 mg/dL  BUN 6 6 - 20 mg/dL   Creatinine, Ser 6.01 0.44 - 1.00 mg/dL   Calcium 8.5 (L) 8.9 - 10.3 mg/dL   Total Protein 5.6 (L) 6.5 - 8.1 g/dL   Albumin 2.2 (L) 3.5 - 5.0 g/dL   AST 15 15 - 41 U/L   ALT 12 0 - 44 U/L   Alkaline Phosphatase 231 (H) 38 - 126 U/L   Total Bilirubin 0.4 0.3 - 1.2 mg/dL   GFR, Estimated >09 >32 mL/min   Anion gap 5 5 - 15  CBC     Status: Abnormal   Collection Time: 11/17/20 10:23 AM  Result Value Ref Range   WBC 6.5 4.0 - 10.5 K/uL   RBC 3.27 (L) 3.87 - 5.11 MIL/uL   Hemoglobin 9.6 (L) 12.0 - 15.0 g/dL   HCT 35.5 (L) 73.2 - 20.2 %   MCV 91.4 80.0 - 100.0 fL   MCH 29.4 26.0 - 34.0 pg   MCHC 32.1 30.0 - 36.0 g/dL   RDW 54.2 70.6 - 23.7 %   Platelets 133 (L) 150 - 400 K/uL   nRBC 0.0 0.0 - 0.2 %    Assessment / Plan: [redacted]w[redacted]d week IUP Labor: Inadequate contractions. ROM x 28+ hours. Start low dose pitocin and titrate to achieve adequate contractions.  Fetal Wellbeing:  Category I-II, overall reassuring Pain Control:  Epidural Anticipated MOD:  SVD A1GDM: Nml CBGs  GHTN: Nml Pre-E labs except for mildly plts. Does not meet criteria for Pre-E. Recheck after delivery or sooner as needed.    Katrinka Blazing, [place], [place] 11/17/2020 12:59 PM

## 2020-11-17 NOTE — Anesthesia Preprocedure Evaluation (Signed)
Anesthesia Evaluation  Patient identified by MRN, date of birth, ID band Patient awake    Reviewed: Allergy & Precautions, NPO status , Patient's Chart, lab work & pertinent test results  Airway Mallampati: II  TM Distance: >3 FB Neck ROM: Full    Dental no notable dental hx.    Pulmonary neg pulmonary ROS,    Pulmonary exam normal breath sounds clear to auscultation       Cardiovascular negative cardio ROS Normal cardiovascular exam Rhythm:Regular Rate:Normal     Neuro/Psych PSYCHIATRIC DISORDERS Anxiety Depression negative neurological ROS     GI/Hepatic negative GI ROS, Neg liver ROS,   Endo/Other  diabetes, GestationalMorbid obesity  Renal/GU negative Renal ROS  negative genitourinary   Musculoskeletal negative musculoskeletal ROS (+)   Abdominal Normal abdominal exam  (+)   Peds negative pediatric ROS (+)  Hematology negative hematology ROS (+) anemia ,   Anesthesia Other Findings   Reproductive/Obstetrics negative OB ROS                             Anesthesia Physical Anesthesia Plan  ASA: II  Anesthesia Plan: Epidural   Post-op Pain Management:    Induction:   PONV Risk Score and Plan: 2 and Treatment may vary due to age or medical condition  Airway Management Planned: Natural Airway  Additional Equipment:   Intra-op Plan:   Post-operative Plan:   Informed Consent: I have reviewed the patients History and Physical, chart, labs and discussed the procedure including the risks, benefits and alternatives for the proposed anesthesia with the patient or authorized representative who has indicated his/her understanding and acceptance.       Plan Discussed with: Anesthesiologist  Anesthesia Plan Comments:         Anesthesia Quick Evaluation

## 2020-11-17 NOTE — MAU Note (Signed)
Pt presents to MAU with c/o questionable ROM and cx's that started around 8 pm and became more intense and frequent around midnight.  Pt also reports bloody show.  +FM

## 2020-11-17 NOTE — Discharge Summary (Signed)
Postpartum Discharge Summary  Date of Service updated 4/7     Patient Name: Deborah Schmidt DOB: 20-Oct-2000 MRN: [id number]  Date of admission: 11/17/2020 Delivery date:11/17/2020  Delivering provider: Chancy Milroy  Date of discharge: 11/21/2020  Admitting diagnosis: Normal labor [O80, Z37.9] Intrauterine pregnancy: [redacted]w[redacted]d    Secondary diagnosis:  Preeclampsia with severe features GDMA1 Depression- on zoloft Anemia H/o Chlamydia  Additional problems: as above    Discharge diagnosis: Term Pregnancy Delivered, Preeclampsia (severe) and GDM A1                                              Post partum procedures:n/a Augmentation: Pitocin Complications: None  Hospital course: Ms SCoryellwas admitted with SROM and underwent augmentation of labor. She failed to progressed unfortunately and was Dx with arrest of dilatation ( 6 cm), fetal intolerance of labor plus Preeclampsia with severe features by BP criteria. C section was recommended to pt. She OP note for additional information. She received magnesium x 24 hrs.   Magnesium Sulfate received: Yes: Seizure prophylaxis BMZ received: No Rhophylac:No MMR:No T-DaP:ordered Flu: N/A Transfusion:No  Physical exam  Vitals:   11/20/20 2259 11/21/20 0434 11/21/20 0717 11/21/20 1121  BP: 135/70 134/64 129/75 124/64  Pulse: 98 (!) 102 (!) 108 (!) 102  Resp: _0 Temp: 97.7 F (36.5 C) 98.4 F (36.9 C) 98.5 F (36.9 C) 98.5 F (36.9 C)  TempSrc: Oral Oral Oral Oral  SpO2: 100% 98% 98% 98%  Weight:      Height:       General: alert, cooperative and no distress Lochia: appropriate Uterine Fundus: firm Incision: Dressing is clean, dry, and intact DVT Evaluation: No evidence of DVT seen on physical exam. Labs: Lab Results  Component Value Date   WBC 12.3 (H) 11/18/2020   HGB 7.7 (L) 11/18/2020   HCT 23.4 (L) 11/18/2020   MCV 92.9 11/18/2020   PLT 129 (L) 11/18/2020   CMP Latest Ref Rng & Units 11/17/2020  Glucose 70 - 99  mg/dL -  BUN 6 - 20 mg/dL -  Creatinine 0.44 - 1.00 mg/dL 0.99  Sodium 135 - 145 mmol/L -  Potassium 3.5 - 5.1 mmol/L -  Chloride 98 - 111 mmol/L -  CO2 22 - 32 mmol/L -  Calcium 8.9 - 10.3 mg/dL -  Total Protein 6.5 - 8.1 g/dL -  Total Bilirubin 0.3 - 1.2 mg/dL -  Alkaline Phos 38 - 126 U/L -  AST 15 - 41 U/L -  ALT 0 - 44 U/L -   Edinburgh Score: Edinburgh Postnatal Depression Scale Screening Tool 11/21/2020  I have been able to laugh and see the funny side of things. 0  I have looked forward with enjoyment to things. 2  I have blamed myself unnecessarily when things went wrong. 2  I have been anxious or worried for no good reason. 2  I have felt scared or panicky for no good reason. 0  Things have been getting on top of me. 1  I have been so unhappy that I have had difficulty sleeping. 1  I have felt sad or miserable. 1  I have been so unhappy that I have been crying. 0  The thought of harming myself has occurred to me. 1  Edinburgh Postnatal Depression Scale Total 10  After visit meds:  Allergies as of 11/21/2020   No Known Allergies     Medication List    STOP taking these medications   aspirin 81 MG chewable tablet   ferrous sulfate 325 (65 FE) MG tablet   Prenatal Complete 14-0.4 MG Tabs     TAKE these medications   docusate sodium 100 MG capsule Commonly known as: Colace Take 1 capsule (100 mg total) by mouth 2 (two) times daily.   ibuprofen 600 MG tablet Commonly known as: ADVIL Take 1 tablet (600 mg total) by mouth every 6 (six) hours as needed.   NIFEdipine 30 MG 24 hr tablet Commonly known as: ADALAT CC Take 1 tablet (30 mg total) by mouth 2 (two) times daily.   oxyCODONE-acetaminophen 5-325 MG tablet Commonly known as: PERCOCET/ROXICET Take 1 tablet by mouth every 6 (six) hours as needed for up to 5 days for moderate pain.   sertraline 100 MG tablet Commonly known as: ZOLOFT Take 1 tablet (100 mg total) by mouth daily.        Discharge  home in stable condition Infant Feeding: Breast Infant Disposition:home with mother Discharge instruction: per After Visit Summary and Postpartum booklet. Activity: Advance as tolerated. Pelvic rest for 6 weeks.  Diet: carb modified diet and low salt diet Future Appointments:No future appointments. Follow up Visit:  [place] [facility] at [facility]. Schedule an appointment as soon as possible for a visit in 1 week(s).   Specialty: Obstetrics and Gynecology Why: Please call to schedule an appointment for next week Contact information: [place] [phone]               Please schedule this patient for a In person postpartum visit in 1 week with the following provider: Any provider. Additional Postpartum F/U:BP check 1 week  High risk pregnancy complicated by: preeclampsia with severe features, GDMA1 Delivery mode:  C-Section, Low Transverse  Anticipated Birth Control:  POPs   11/21/2020 Annalee Genta, DO

## 2020-11-18 ENCOUNTER — Encounter (HOSPITAL_COMMUNITY): Payer: Self-pay | Admitting: Obstetrics and Gynecology

## 2020-11-18 DIAGNOSIS — O1415 Severe pre-eclampsia, complicating the puerperium: Secondary | ICD-10-CM

## 2020-11-18 LAB — CBC
HCT: 23.4 % — ABNORMAL LOW (ref 36.0–46.0)
Hemoglobin: 7.7 g/dL — ABNORMAL LOW (ref 12.0–15.0)
MCH: 30.6 pg (ref 26.0–34.0)
MCHC: 32.9 g/dL (ref 30.0–36.0)
MCV: 92.9 fL (ref 80.0–100.0)
Platelets: 129 10*3/uL — ABNORMAL LOW (ref 150–400)
RBC: 2.52 MIL/uL — ABNORMAL LOW (ref 3.87–5.11)
RDW: 13.1 % (ref 11.5–15.5)
WBC: 12.3 10*3/uL — ABNORMAL HIGH (ref 4.0–10.5)
nRBC: 0 % (ref 0.0–0.2)

## 2020-11-18 LAB — GC/CHLAMYDIA PROBE AMP (~~LOC~~) NOT AT ARMC
Chlamydia: NEGATIVE
Comment: NEGATIVE
Comment: NORMAL
Neisseria Gonorrhea: NEGATIVE

## 2020-11-18 LAB — GLUCOSE, CAPILLARY: Glucose-Capillary: 94 mg/dL (ref 70–99)

## 2020-11-18 MED ORDER — EPINEPHRINE PF 1 MG/ML IJ SOLN
0.3000 mg | Freq: Once | INTRAMUSCULAR | Status: DC | PRN
  Filled 2020-11-18: qty 1

## 2020-11-18 MED ORDER — SODIUM CHLORIDE 0.9 % IV BOLUS
500.0000 mL | Freq: Once | INTRAVENOUS | Status: DC | PRN
  Filled 2020-11-18: qty 500

## 2020-11-18 MED ORDER — METHYLPREDNISOLONE SODIUM SUCC 125 MG IJ SOLR: 125.0000 mg | Freq: Once | INTRAMUSCULAR | Status: DC | PRN

## 2020-11-18 MED ORDER — ALBUTEROL SULFATE (2.5 MG/3ML) 0.083% IN NEBU: 2.5000 mg | INHALATION_SOLUTION | Freq: Once | RESPIRATORY_TRACT | Status: DC | PRN

## 2020-11-18 MED ORDER — SODIUM CHLORIDE 0.9 % IV SOLN
INTRAVENOUS | Status: DC | PRN
  Filled 2020-11-18: qty 1000

## 2020-11-18 MED ORDER — LACTATED RINGERS IV SOLN
INTRAVENOUS | Status: DC
  Filled 2020-11-18 (×2): qty 1000

## 2020-11-18 MED ORDER — DIPHENHYDRAMINE HCL 50 MG/ML IJ SOLN: 25.0000 mg | Freq: Once | INTRAMUSCULAR | Status: DC | PRN

## 2020-11-18 MED ORDER — SODIUM CHLORIDE 0.9 % IV SOLN
500.0000 mg | Freq: Once | INTRAVENOUS | Status: AC
  Administered 2020-11-18: 500 mg via INTRAVENOUS
  Filled 2020-11-18: qty 25

## 2020-11-18 NOTE — Progress Notes (Signed)
  Subjective: Postpartum Day 1: Cesarean Delivery d/t arrest of dilatation, fetal intolerance of labor and SPEC ( BP criteria)   Ms Proano reports feeling tired this morning. She denies HA or visual changes. Pain controlled. Tolerating liquid diet without probelms   Objective: Vital signs in last 24 hours: Temp:  [97.9 F (36.6 C)-99.4 F (37.4 C)] 98 F (36.7 C) (04/04 0401) Pulse Rate:  [63-114] 94 (04/04 0401) Resp:  [14-30] 20 (04/04 0401) BP: (107-170)/(43-103) 129/73 (04/04 0401) SpO2:  [96 %-100 %] 97 % (04/03 2355) Weight:  [82.3 kg] 82.3 kg (04/04 0626)  Physical Exam:  General: alert Lochia: appropriate Uterine Fundus: firm Incision: dressing intact DVT Evaluation: No evidence of DVT seen on physical exam.  Recent Labs    11/17/20 2155 11/18/20 0227  HGB 8.5* 7.7*  HCT 26.0* 23.4*    Assessment/Plan: Status post Cesarean section. Doing well postoperatively. Magnesium x 24 hours. BP stable, will continue to monitor. IV iron for anemia.  Continue current care.  Hermina Staggers 11/18/2020, 7:37 AM

## 2020-11-18 NOTE — Lactation Note (Addendum)
This note was copied from a baby's chart. Lactation Consultation Note  Patient Name: Deborah Schmidt [id number] Date: 11/18/2020 Reason for consult: Follow-up assessment;1st time breastfeeding;Term Age:22 hours  P1, [redacted]w[redacted]d. Baby on triple phototherapy.  Assisted with latching baby. Demonstrated how to place photo light under baby while breastfeeding.  [name] NP came in for infant assessment during consult and emphasized with family the importance of keeping phototherapy light on baby during feedings.  Mother has difficulty supporting head due to IV.  LC assisted with latch.   Baby is eager to feed and had frequent swallows. Mother recently pumped 5 ml which was given to baby after breastfeeding.  Parents are supplementing after with formula.  Reviewed volume guidelines for when baby breastfeeds  Reviewed how to wash pump parts and reviewed milk storage with FOB.    Feeding Mother's Current Feeding Choice: Breast Milk and Formula  LATCH Score Latch: Grasps breast easily, tongue down, lips flanged, rhythmical sucking.  Audible Swallowing: Spontaneous and intermittent  Type of Nipple: Everted at rest and after stimulation  Comfort (Breast/Nipple): Soft / non-tender  Hold (Positioning): Assistance needed to correctly position infant at breast and maintain latch.  LATCH Score: 9   Lactation Tools Discussed/Used Breast pump type: Double-Electric Breast Pump Pump Education: Milk Storage Reason for Pumping: stimulation and supplementation wiht high bilirubin Pumping frequency: q 3 hours Pumped volume: 5 mL  Interventions Interventions: Breast feeding basics reviewed;Assisted with latch;Skin to skin;DEBP;Education;Position options;Support pillows  Discharge Pump: DEBP  Consult Status Consult Status: Follow-up Date: 11/19/20 Follow-up type: In-patient    [name] [facility] 11/18/2020, 9:53 AM

## 2020-11-18 NOTE — Progress Notes (Signed)
Postop PROGRESS NOTE  PPD #1  Subjective:  Breane Grunwald is a 20 y.o. G1P1001 s/p pLTCS at [redacted]w[redacted]d. Today she notes no acute complaints. Tolerating general diet.  Foley in place, she has been up to ambulate.  Denies nausea or vomiting. She has no flatus, noBM.  Pain is well controlled.  Lochia minimal Denies fever/chills/chest pain/SOB.  no HA, no blurry vision, noRUQ pain  Objective: Blood pressure (!) 156/72, pulse (!) 119, temperature 98.5 F (36.9 C), temperature source Oral, resp. rate 18, height 5' (1.524 m), weight 82.3 kg, last menstrual period 02/20/2020, SpO2 99 %, unknown if currently breastfeeding.  Physical Exam:  General: alert, cooperative and no distress Chest: no respiratory distress Heart:regular rat and rhythm Abdomen: soft, nontender, +BS Uterine Fundus: firm, appropriately tender Incision: C/D/I with honeycomb DVT Evaluation: No calf swelling or tenderness Extremities: no edema Skin: warm, dry  Results for orders placed or performed during the hospital encounter of 11/17/20 (from the past 24 hour(s))  Glucose, capillary     Status: Abnormal   Collection Time: 11/17/20  2:02 PM  Result Value Ref Range   Glucose-Capillary 142 (H) 70 - 99 mg/dL  Glucose, capillary     Status: None   Collection Time: 11/17/20  5:57 PM  Result Value Ref Range   Glucose-Capillary 88 70 - 99 mg/dL  Glucose, capillary     Status: Abnormal   Collection Time: 11/17/20  7:46 PM  Result Value Ref Range   Glucose-Capillary 101 (H) 70 - 99 mg/dL  CBC     Status: Abnormal   Collection Time: 11/17/20  7:59 PM  Result Value Ref Range   WBC 10.7 (H) 4.0 - 10.5 K/uL   RBC 2.86 (L) 3.87 - 5.11 MIL/uL   Hemoglobin 8.4 (L) 12.0 - 15.0 g/dL   HCT 32.9 (L) 92.4 - 26.8 %   MCV 92.3 80.0 - 100.0 fL   MCH 29.4 26.0 - 34.0 pg   MCHC 31.8 30.0 - 36.0 g/dL   RDW 34.1 96.2 - 22.9 %   Platelets 126 (L) 150 - 400 K/uL   nRBC 0.0 0.0 - 0.2 %  CBC     Status: Abnormal   Collection Time: 11/17/20   9:55 PM  Result Value Ref Range   WBC 11.9 (H) 4.0 - 10.5 K/uL   RBC 2.81 (L) 3.87 - 5.11 MIL/uL   Hemoglobin 8.5 (L) 12.0 - 15.0 g/dL   HCT 79.8 (L) 92.1 - 19.4 %   MCV 92.5 80.0 - 100.0 fL   MCH 30.2 26.0 - 34.0 pg   MCHC 32.7 30.0 - 36.0 g/dL   RDW 17.4 08.1 - 44.8 %   Platelets 141 (L) 150 - 400 K/uL   nRBC 0.0 0.0 - 0.2 %  Creatinine, serum     Status: None   Collection Time: 11/17/20  9:55 PM  Result Value Ref Range   Creatinine, Ser 0.99 0.44 - 1.00 mg/dL   GFR, Estimated >18 >56 mL/min  CBC     Status: Abnormal   Collection Time: 11/18/20  2:27 AM  Result Value Ref Range   WBC 12.3 (H) 4.0 - 10.5 K/uL   RBC 2.52 (L) 3.87 - 5.11 MIL/uL   Hemoglobin 7.7 (L) 12.0 - 15.0 g/dL   HCT 31.4 (L) 97.0 - 26.3 %   MCV 92.9 80.0 - 100.0 fL   MCH 30.6 26.0 - 34.0 pg   MCHC 32.9 30.0 - 36.0 g/dL   RDW 78.5 88.5 - 02.7 %  Platelets 129 (L) 150 - 400 K/uL   nRBC 0.0 0.0 - 0.2 %  Glucose, capillary     Status: None   Collection Time: 11/18/20  4:42 AM  Result Value Ref Range   Glucose-Capillary 94 70 - 99 mg/dL    Assessment/Plan: Tariana Moldovan is a 20 y.o. G1P1001 s/p pLTCS at [redacted]w[redacted]d POD#1 complicated by: 1) Preeclampsia with severe features -currently on Magnesium, to d/c this evening -no BP medication currently, will continue to monitor -UOP adequate  2) Anemia  -CBC as above, plan for IV iron  3) GDMA1- am glucola within normal range  - Foley to be removed later today -Routine postop care -h/o Chlamydia, TOC negative  Contraception: plan to review Feeding: breast  Dispo: Continue with postpartum care as outlined above   LOS: 1 day   Myna Hidalgo, DO Faculty Attending, [facility] 11/18/2020, 12:38 PM

## 2020-11-19 MED ORDER — NIFEDIPINE ER OSMOTIC RELEASE 30 MG PO TB24
30.0000 mg | ORAL_TABLET | Freq: Two times a day (BID) | ORAL | Status: DC
  Administered 2020-11-19 – 2020-11-21 (×5): 30 mg via ORAL
  Filled 2020-11-19 (×6): qty 1

## 2020-11-19 NOTE — Progress Notes (Signed)
Subjective: Postpartum Day 1: Cesarean Delivery Patient reports tolerating PO, + flatus and no problems voiding.    Objective: Vital signs in last 24 hours: Temp:  [97.9 F (36.6 C)-99.1 F (37.3 C)] 99.1 F (37.3 C) (04/05 0835) Pulse Rate:  [86-119] 89 (04/05 0836) Resp:  [16-18] 16 (04/05 0835) BP: (115-156)/(58-77) 145/74 (04/05 0836) SpO2:  [97 %-99 %] 98 % (04/05 0835)  Physical Exam:  General: alert, cooperative and appears stated age Lochia: appropriate Uterine Fundus: firm Incision: healing well DVT Evaluation: No evidence of DVT seen on physical exam.  Recent Labs    11/17/20 2155 11/18/20 0227  HGB 8.5* 7.7*  HCT 26.0* 23.4*    Assessment/Plan: Status post Cesarean section. Doing well postoperatively.  Continue current care. Breast and bottle feeding Baby under Bili lights, likely not going home today Circ consent obtained. POPs. Begin Procardia for BP S/p Venofer  Reva Bores 11/19/2020, 8:59 AM

## 2020-11-19 NOTE — Lactation Note (Signed)
This note was copied from a baby's chart. Lactation Consultation Note  Patient Name: Boy Chevy Virgo [id number] Date: 11/19/2020 Reason for consult: Follow-up assessment;Hyperbilirubinemia Age:22 hours  Baby on bili-lights. Mom with +breast changes today. She was pumping during visit with copious colostrum in containers. Encouraged mom to offer breast / EBM before supplementing with formula.  Feeding Mother's Current Feeding Choice: Breast Milk and Formula  Consult Status: Follow-up Follow-up type: In-patient   Elder Negus, MA IBCLC 11/19/2020, 9:01 AM

## 2020-11-19 NOTE — Clinical Social Work Maternal (Signed)
CLINICAL SOCIAL WORK MATERNAL/CHILD NOTE  Patient Details  Name: Deborah Schmidt MRN: [id number] Date of Birth: 05/29/2001  Date:  11/19/2020  Clinical Social Worker Initiating Note:  Angeliz Settlemyre, LCSW Date/Time: Initiated:  11/19/20/1324     Child's Name:  Deborah Schmidt   Biological Parents:  Mother,Father (Father: Dontae Schmidt 05/19/88)   Need for Interpreter:  None   Reason for Referral:  Behavioral Health Concerns,Current Substance Use/Substance Use During Pregnancy    Address:  [address] [place] [address]    Phone number:  [phone] (home)     Additional phone number:   Household Members/Support Persons (HM/SP):   Household Member/Support Person 1,Household Member/Support Person 2,Household Member/Support Person 3   HM/SP Name Relationship DOB or Age  HM/SP -1 Tonya Massey mom    HM/SP -2   stepdad    HM/SP -3   brother    HM/SP -4        HM/SP -5        HM/SP -6        HM/SP -7        HM/SP -8          Natural Supports (not living in the home):  Spouse/significant other   Professional Supports: Therapist   Employment:     Type of Work:     Education:  High school graduate   Homebound arranged:    Financial Resources:  Medicaid   Other Resources:  WIC   Cultural/Religious Considerations Which May Impact Care:   Strengths:  Ability to meet basic needs ,Home prepared for child ,Psychotropic Medications   Psychotropic Medications:  Zoloft      Pediatrician:       Pediatrician List:   [place]    [place]    [place]    [place]    [place]    [place]      Pediatrician Fax Number:    Risk Factors/Current Problems:  Substance Use ,Mental Health Concerns    Cognitive State:  Able to Concentrate ,Alert ,Insightful ,Goal Oriented ,Linear Thinking    Mood/Affect:  Comfortable ,Calm ,Interested ,Relaxed    CSW Assessment: CSW met with MOB at bedside to discuss behavioral health  concerns and substance use during pregnancy. CSW introduced self and explained reason for consult. MOB was welcoming, pleasant, open and remained engaged during assessment. MOB reported that she resides with her mom, step dad and brother. MOB reported that she receives WIC. MOB reported that she has all items needed to care for infant including a car seat and basinet. MOB reported that she had not selected a pediatrician, CSW provided pediatrician list resource to MOB. CSW inquired about MOB's support system, MOB reported that her mom and FOB are supports.   CSW inquired about MOB's mental health history. MOB reported that she was diagnosed with depression and anxiety at age 15. MOB reported that she was diagnosed with PTSD in 2021. MOB attributed her PTSD diagnosis to childhood trauma and issues with FOB. MOB reported that she is currently taking Zoloft and participating in therapy, noting both are helpful. MOB reported that she plans to continue taking her medication and participating in therapy. MOB shared that her therapy is once every 3 weeks. CSW inquired about MOB's coping skills, MOB reported that she listens to music and talks to her mom. CSW inquired about how MOB was feeling emotionally after giving birth, MOB reported that she was feeling okay and a little anxious about infant's medical   condition. CSW acknowledged and validated MOB's feelings. MOB presented calm and did not demonstrate any acute mental health signs/symptoms. MOB possessed insight about her mental health and treatment. CSW assessed for safety, MOB denied SI, HI and domestic violence. CSW informed MOB that she may be more susceptible to PPD due to her mental health history.   CSW provided education regarding the baby blues period vs. perinatal mood disorders, discussed treatment and gave resources for mental health follow up if concerns arise.  CSW recommends self-evaluation during the postpartum time period using the New Mom Checklist  from Postpartum Progress and encouraged MOB to contact a medical professional if symptoms are noted at any time.    CSW provided review of Sudden Infant Death Syndrome (SIDS) precautions.    CSW informed MOB about the hospital drug screen policy due to substance use during pregnancy. 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 MOB denied any other substance use during pregnancy. CSW informed MOB that infant's CDS would be monitored and a CPS report would be made if warranted. MOB verbalized understanding and denied any questions/concerns.   CSW identifies no further need for intervention and no barriers to discharge at this time.  CSW asked RN to order CDS, no UDS was ordered.    CSW Plan/Description:  No Further Intervention Required/No Barriers to Discharge,Sudden Infant Death Syndrome (SIDS) Education,Perinatal Mood and Anxiety Disorder (PMADs) Education,Hospital Drug Screen Policy Information,CSW Will Continue to Monitor Umbilical Cord Tissue Drug Screen Results and Make Report if Warranted,Other Information/Referral to Community Resources    Jasey Cortez L Fiona Coto, LCSW 11/19/2020, 1:27 PM 

## 2020-11-20 NOTE — Progress Notes (Signed)
POSTPARTUM PROGRESS NOTE  POD #2  Subjective:  Deborah Schmidt is a 20 y.o. G1P1001 s/p pLTCS at [redacted]w[redacted]d. Today she notes no acute complaints regarding her care.  Mostly concerned about her baby as he was transferred to NICU overnight. She denies any problems with ambulating, voiding or po intake. Denies nausea or vomiting. She has + flatus, noBM.  Pain is controlled with current medication.  Lochia minimal Denies fever/chills/chest pain/SOB.  no HA, no blurry vision, no RUQ pain  Objective: Blood pressure 133/75, pulse 96, temperature 99.4 F (37.4 C), temperature source Oral, resp. rate 17, height 5' (1.524 m), weight 82.3 kg, last menstrual period 02/20/2020, SpO2 99 %, unknown if currently breastfeeding.  Physical Exam:  General: alert, cooperative and no distress Chest: no respiratory distress Heart: regular rate and rhythm Abdomen: soft, nontender Uterine Fundus: firm, appropriately tender Incision: C/D/I with honeycomb DVT Evaluation: No calf swelling or tenderness Extremities: no edema Skin: warm, dry  No results found for this or any previous visit (from the past 24 hour(s)).  Assessment/Plan: Deborah Schmidt is a 20 y.o. G1P1001 s/p pLTCS at [redacted]w[redacted]d POD#2 complicated by: 1) Preeclampsia with severe features -s/p Magneisum -BP well controlled with procardiaXL 30mg  bid -pt asymptomatic  2) GDMA1 -fasting within normal limits  Contraception: POPs Feeding: breast and bottle feeding, baby currently in NICU  Dispo: Continue with routine postop care   LOS: 3 days   , DO Faculty Attending, [facility] 11/20/2020, 10:56 AM

## 2020-11-20 NOTE — Lactation Note (Signed)
This note was copied from a baby's chart. Lactation Consultation Note  Patient Name: Deborah Schmidt [id number] Date: 11/20/2020 Reason for consult: Follow-up assessment Age:22 hours   LC Follow Up Visit:  Attempted to visit with mother in her room, however, she was not present.  Will have NICU LC follow up in the NICU.   Maternal Data    Feeding Nipple Type: Slow - flow  LATCH Score                    Lactation Tools Discussed/Used    Interventions    Discharge    Consult Status Consult Status: Follow-up Date: 11/20/20 Follow-up type: In-patient    [name] 11/20/2020, 2:23 PM

## 2020-11-20 NOTE — Lactation Note (Signed)
This note was copied from a baby's chart. Lactation Consultation Note LC to room for f/u visit. Baby has returned to mother's room. Mom's milk is in but without engorgement today. She is aware to continue bf'ing and pumping prn for her own comfort. Will plan f/u visit tomorrow.  Patient Name: Deborah Schmidt Date: 11/20/2020 Reason for consult: Follow-up assessment Age:22 hours  Maternal Data  Last pumping yield was . Breasts soft at this time  Feeding Mother's Current Feeding Choice: Breast Milk and Formula Nipple Type: Slow - flow   Lactation Tools Discussed/Used Pumped volume: 75 mL  Consult Status Consult Status: Follow-up Follow-up type: In-patient   Elder Negus, MA IBCLC 11/20/2020, 6:35 PM

## 2020-11-21 ENCOUNTER — Other Ambulatory Visit (HOSPITAL_COMMUNITY): Payer: Self-pay

## 2020-11-21 DIAGNOSIS — O24430 Gestational diabetes mellitus in the puerperium, diet controlled: Secondary | ICD-10-CM

## 2020-11-21 MED ORDER — DOCUSATE SODIUM 100 MG PO CAPS
100.0000 mg | ORAL_CAPSULE | Freq: Two times a day (BID) | ORAL | 0 refills | Status: DC
  Filled 2020-11-21: qty 40, 20d supply, fill #0

## 2020-11-21 MED ORDER — OXYCODONE-ACETAMINOPHEN 5-325 MG PO TABS
1.0000 | ORAL_TABLET | Freq: Four times a day (QID) | ORAL | 0 refills | Status: AC | PRN
  Filled 2020-11-21: qty 10, 3d supply, fill #0

## 2020-11-21 MED ORDER — NIFEDIPINE ER 30 MG PO TB24
30.0000 mg | ORAL_TABLET | Freq: Two times a day (BID) | ORAL | 0 refills | Status: DC
  Filled 2020-11-21: qty 60, 30d supply, fill #0

## 2020-11-21 MED ORDER — IBUPROFEN 600 MG PO TABS
600.0000 mg | ORAL_TABLET | Freq: Four times a day (QID) | ORAL | 0 refills | Status: DC | PRN
Start: 2020-11-21 — End: 2024-03-01
  Filled 2020-11-21: qty 30, 7d supply, fill #0

## 2020-11-21 MED ORDER — SERTRALINE HCL 100 MG PO TABS
1.0000 | ORAL_TABLET | Freq: Every day | ORAL | 3 refills | Status: DC
Start: 2020-11-21 — End: 2024-03-01
  Filled 2020-11-21: qty 30, 30d supply, fill #0

## 2020-11-21 NOTE — Progress Notes (Signed)
CSW received consult due to score 10 on Edinburgh Depression Screen. MOB answered yes(hardly ever)  to question 10 (the thought of harming myself has occurred to me). CSW met with MOB at bedside. MOB was familiar with CSW as CSW met with MOB on 4/5 and 4/6. CSW explained reason for consult. MOB presented pleasant and remained engaged with CSW. CSW and MOB discussed MOB answering yes to question 10. MOB reported that she had passive thoughts of SI prior to being admitted due to stuff she was going through. MOB reported that she had no plan or intentions. MOB shared that her son is a reason to live. CSW positively affirmed MOB's protective factors. MOB verbalized plan to follow up with her therapist and to continue taking her Zoloft. CSW assessed for safety, MOB denied SI, HI and domestic violence. CSW inquired about MOB's interest in community supports for parenting education/support, MOB declined interest. CSW asked if there was anything CSW could do that would be helpful, MOB reported no needs. MOB thanked CSW for visit.   CSW previously provided education on 11/19/20, regarding Baby Blues vs PMADs and provided MOB with resources for mental health follow up.  CSW encouraged MOB to evaluate her mental health throughout the postpartum period with the use of the New Mom Checklist developed by Postpartum Progress as well as the [place] Postnatal Depression Scale and notify a medical professional if symptoms arise.    Abundio Miu, [place] Worker [facility] Cell#: [phone]

## 2020-11-21 NOTE — Progress Notes (Signed)
Patient downloaded Bab[employer] My Journey app and RN gave patient a BP monitor with appropriate cuff size.  Pt successfully measured BP using correct technique and manually entered the reading into the app.  RN confirmed that reading crossed over into [facility] account and RN able to view reading there.

## 2020-11-21 NOTE — Lactation Note (Signed)
This note was copied from a baby's chart. Lactation Consultation Note  Patient Name: Deborah Schmidt [id number] Date: 11/21/2020 Reason for consult: Follow-up assessment;Primapara;1st time breastfeeding;Term Age:22 days  LC in to visit with P1 Mom of term baby on day of discharge.  Baby has been on double phototherapy, bilirubin level down below light level this am.  Baby at 4% weight loss.  Mom is choosing to alternate breast with formula.  Mom states she is now offering breast first and then supplementing if baby is acting hungry.  Encouraged a deep latch to breast, offering both breasts prn.    Encouraged STS and offering breast often with cues, goal of 8-12 feedings per 24 hrs.  Baby in laid back position, STS with a deep latch.  No discomfort felt.  Mom has been pumping and there is EBM in refrigerator.  Recommended and urged Mom to use the EBM rather than the formula.  Reviewed storage guidelines.  Mom has a pump at home, and encouraged to take the pump parts home with her as the gift shop rents DEBP and WIC has [employer] pumps to loan as well.   Engorgement prevention and treatment reviewed. Mom aware of OP lactation support and encouraged to call prn. No further questions or concerns.   LATCH Score Latch: Grasps breast easily, tongue down, lips flanged, rhythmical sucking.  Audible Swallowing: Spontaneous and intermittent  Type of Nipple: Everted at rest and after stimulation  Comfort (Breast/Nipple): Soft / non-tender  Hold (Positioning): No assistance needed to correctly position infant at breast.  LATCH Score: 10   Lactation Tools Discussed/Used Tools: Pump;Bottle Breast pump type: Double-Electric Breast Pump  Interventions Interventions: Breast feeding basics reviewed;Skin to skin;Breast massage;Hand express;DEBP;Education  Discharge Discharge Education: Engorgement and breast care Pump: Personal  Consult Status Consult Status: Complete Follow-up type: Call as  needed    Judee Clara 11/21/2020, 8:32 AM

## 2020-11-21 NOTE — Progress Notes (Signed)
CSW checked in with MOB to see how she was doing, MOB reported that she was doing good and feels well informed about infant's care. CSW inquired about any needs/concerns, MOB reported none. CSW encouraged MOB to contact CSW if any needs/concerns arise.   Julieana Eshleman, LCSW Clinical Social Worker [facility] Cell#: [phone]  

## 2021-02-27 IMAGING — CR DG CHEST 2V
2 series · 2 of 2 positions shown · non-contrast
Comparison: None.

CLINICAL DATA: Hemoptysis

EXAM:
CHEST - 2 VIEW

[w chest pa]
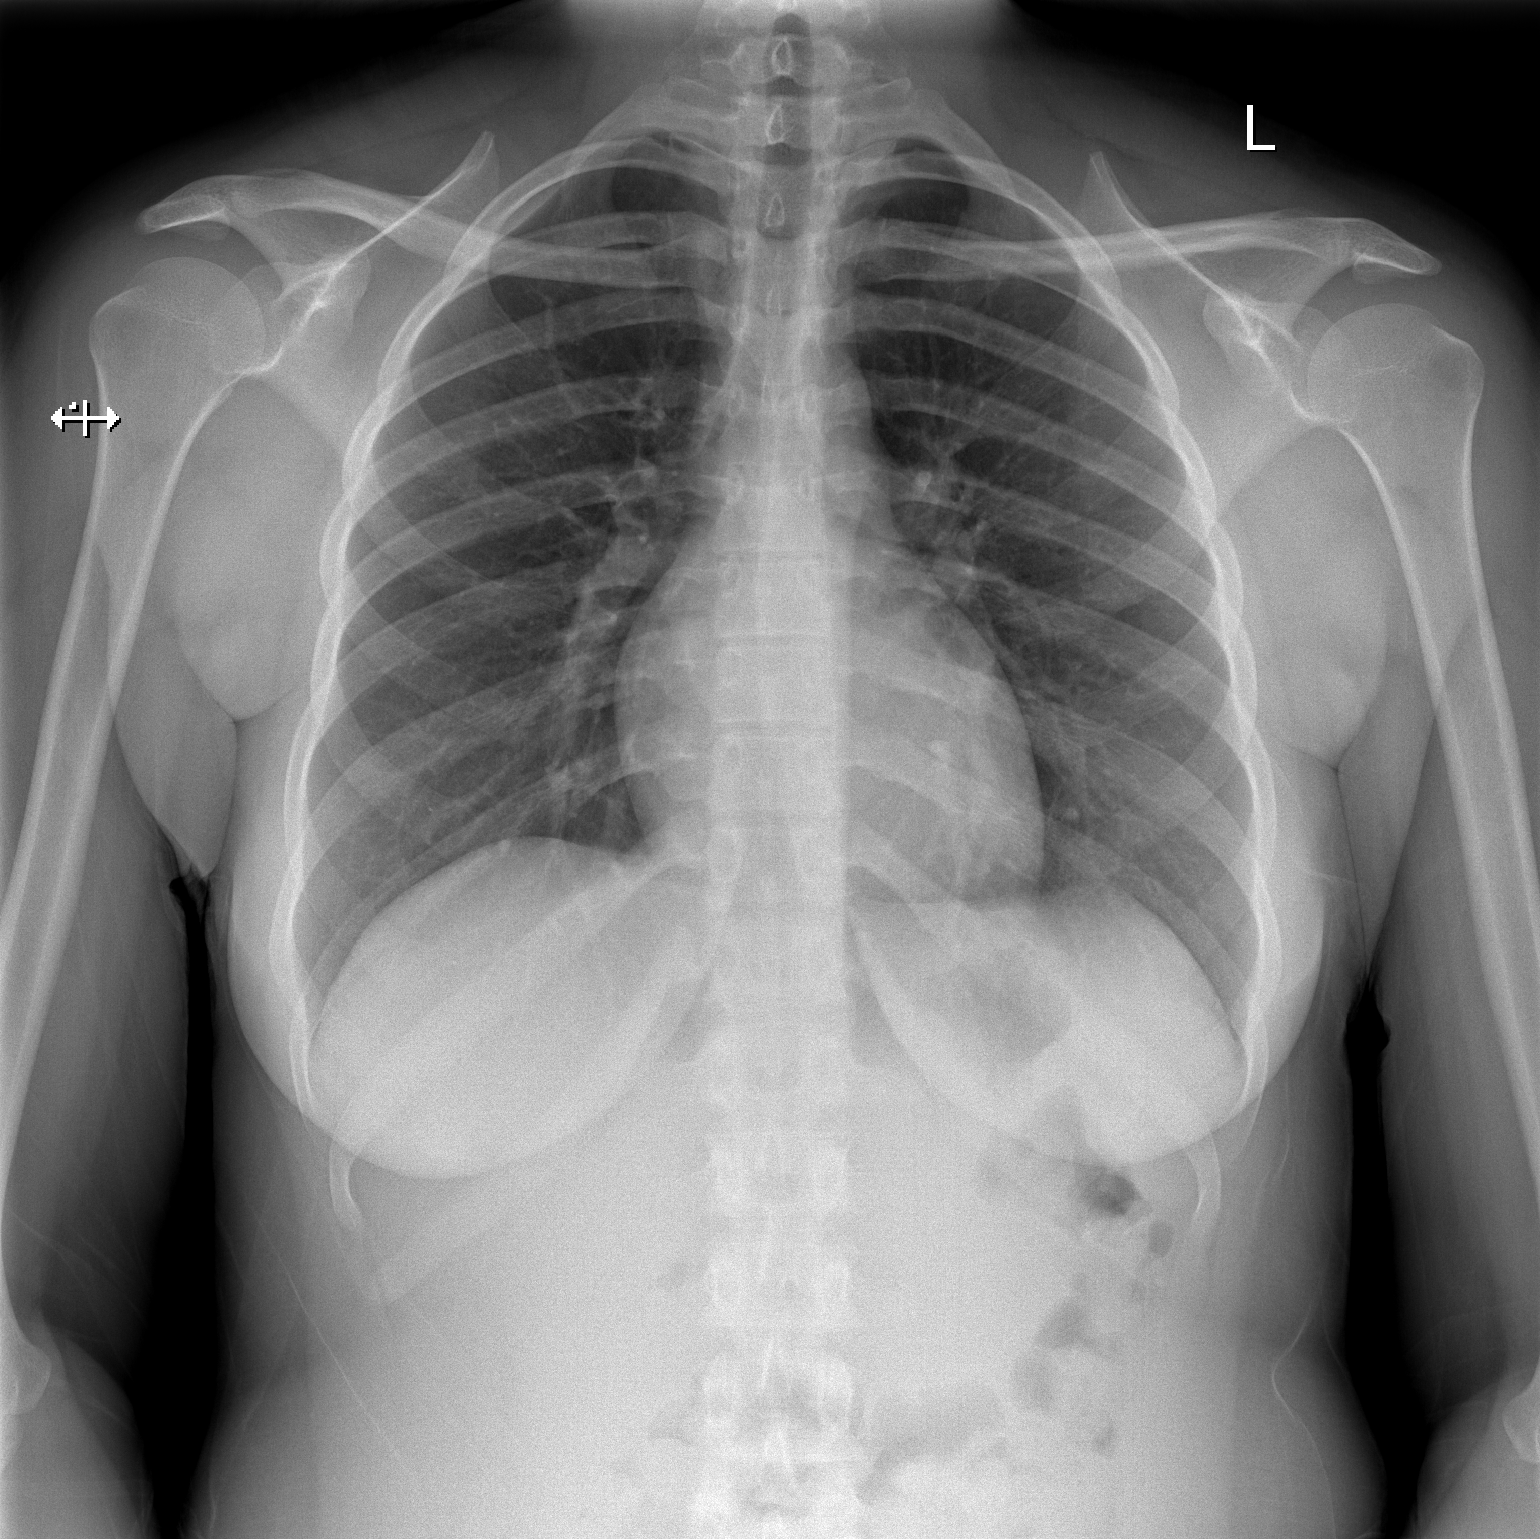

[w chest lat]
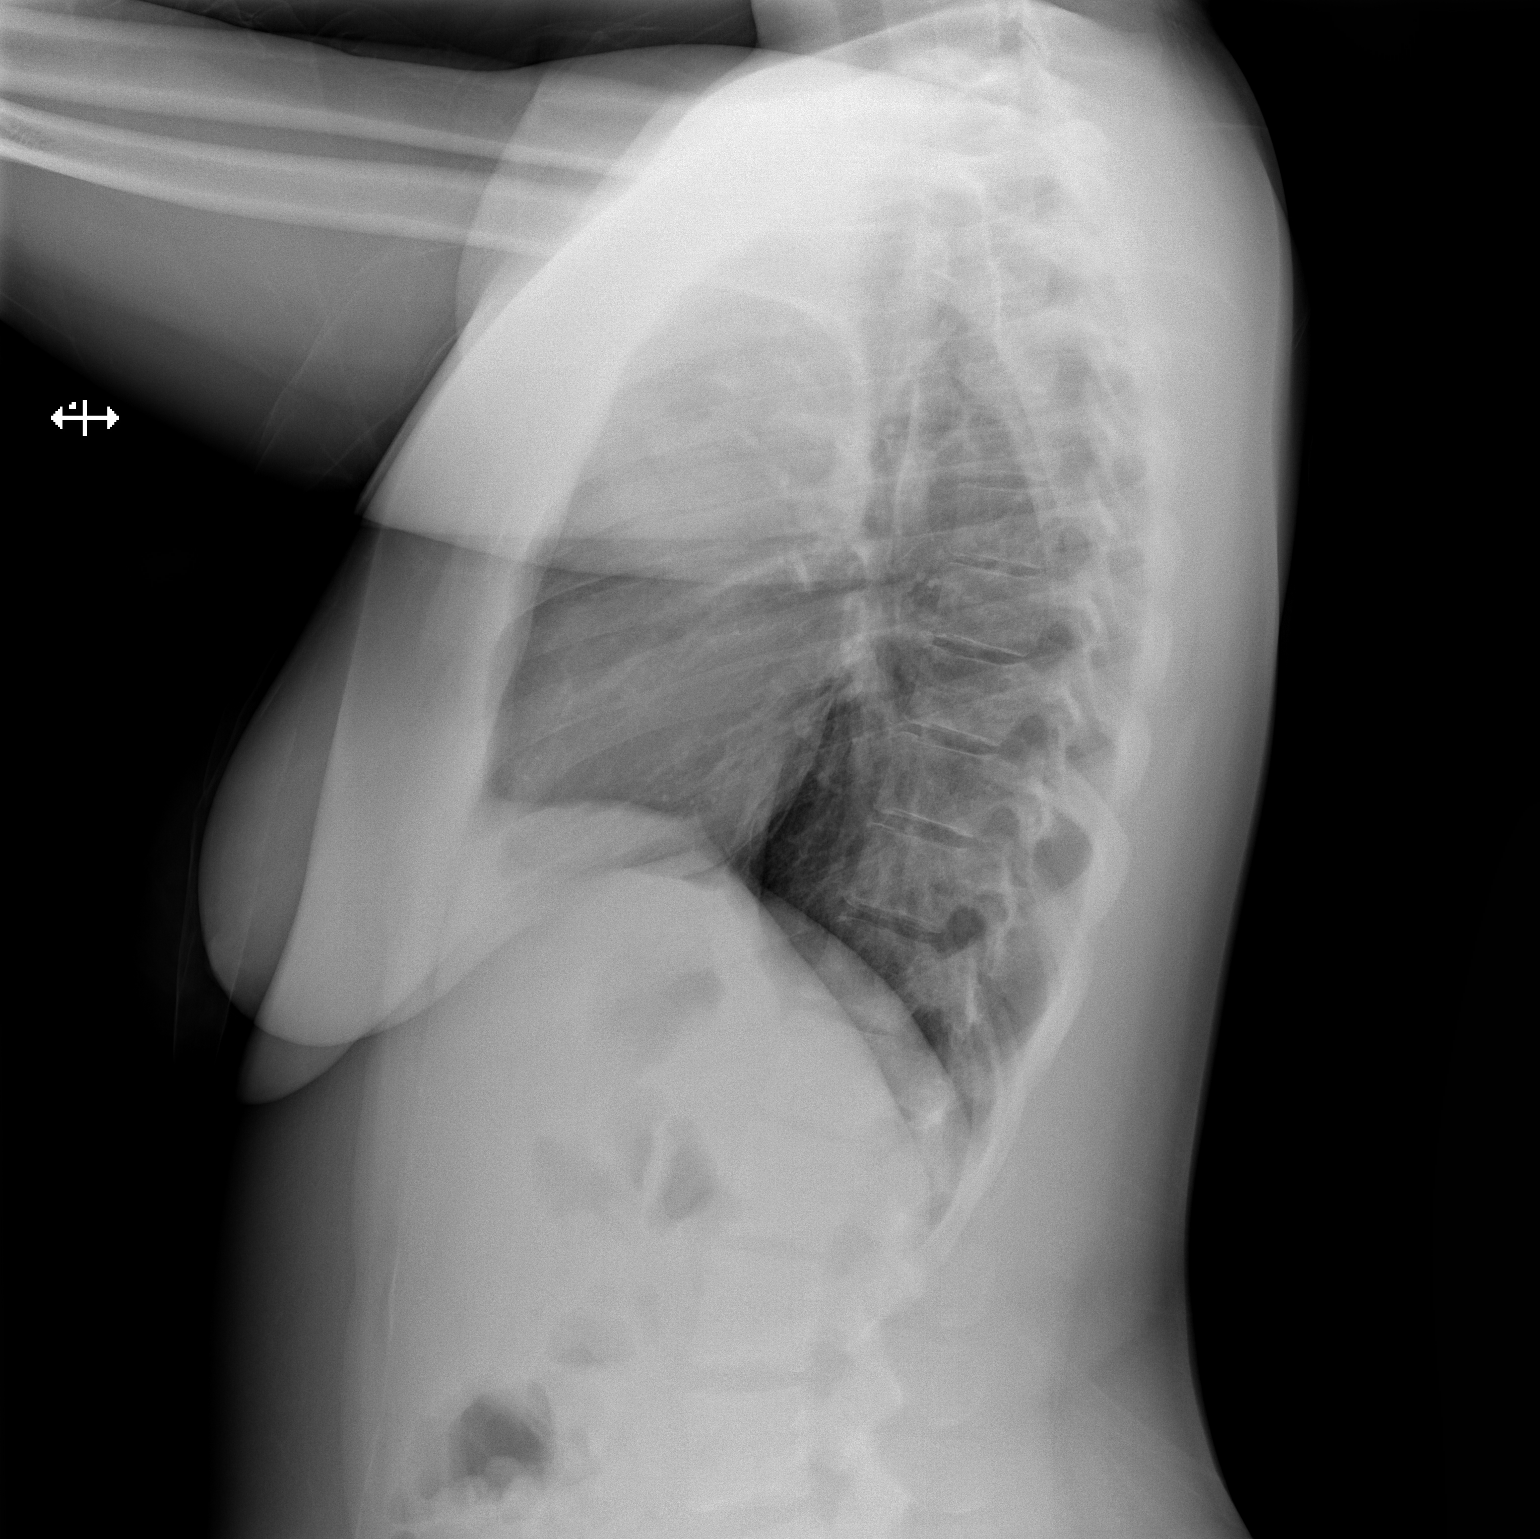

[2 of 2 positions shown; findings below may reference images not displayed]

FINDINGS: Lungs are clear. Heart size and pulmonary vascularity are normal. No
adenopathy. No pneumothorax. No bone lesions.
IMPRESSION: No abnormality noted. A cause for hemoptysis has not been
established with this study.

## 2022-07-13 ENCOUNTER — Emergency Department (HOSPITAL_COMMUNITY)
Admission: EM | Admit: 2022-07-13 | Discharge: 2022-07-13 | Discharge status: Against Medical Advice | Payer: Medicaid Other | Attending: Emergency Medicine | Admitting: Emergency Medicine

## 2022-07-13 ENCOUNTER — Other Ambulatory Visit: Payer: Self-pay

## 2022-07-13 ENCOUNTER — Encounter (HOSPITAL_COMMUNITY): Payer: Self-pay

## 2022-07-13 DIAGNOSIS — Z5321 Procedure and treatment not carried out due to patient leaving prior to being seen by health care provider: Secondary | ICD-10-CM | POA: Insufficient documentation

## 2022-07-13 DIAGNOSIS — Y92410 Unspecified street and highway as the place of occurrence of the external cause: Secondary | ICD-10-CM | POA: Insufficient documentation

## 2022-07-13 DIAGNOSIS — M545 Low back pain, unspecified: Secondary | ICD-10-CM | POA: Diagnosis not present

## 2022-07-13 NOTE — ED Notes (Signed)
Pt called for vitals. No answer at this time.
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
## 2022-07-14 ENCOUNTER — Emergency Department (HOSPITAL_BASED_OUTPATIENT_CLINIC_OR_DEPARTMENT_OTHER)
Admission: EM | Admit: 2022-07-14 | Discharge: 2022-07-14 | Disposition: A | Discharge status: Home / Self Care | Payer: Medicaid Other | Attending: Emergency Medicine | Admitting: Emergency Medicine

## 2022-07-14 ENCOUNTER — Other Ambulatory Visit: Payer: Self-pay

## 2022-07-14 ENCOUNTER — Emergency Department (HOSPITAL_BASED_OUTPATIENT_CLINIC_OR_DEPARTMENT_OTHER): Payer: Medicaid Other | Admitting: Radiology

## 2022-07-14 ENCOUNTER — Encounter (HOSPITAL_BASED_OUTPATIENT_CLINIC_OR_DEPARTMENT_OTHER): Payer: Self-pay | Admitting: Emergency Medicine

## 2022-07-14 DIAGNOSIS — M545 Low back pain, unspecified: Secondary | ICD-10-CM | POA: Insufficient documentation

## 2022-07-14 LAB — PREGNANCY, URINE: Preg Test, Ur: NEGATIVE

## 2022-07-14 MED ORDER — CYCLOBENZAPRINE HCL 10 MG PO TABS: 10.0000 mg | ORAL_TABLET | Freq: Two times a day (BID) | ORAL | 0 refills | Status: DC | PRN

## 2022-07-14 MED ORDER — ACETAMINOPHEN 500 MG PO TABS
1000.0000 mg | ORAL_TABLET | Freq: Once | ORAL | Status: AC
  Administered 2022-07-14: 1000 mg via ORAL
  Filled 2022-07-14: qty 2

## 2022-07-14 NOTE — Discharge Instructions (Addendum)
Please take tylenol/ibuprofen and/or flexeril for pain.  Use cold/heat packs.  I recommend close follow-up with PCP or orthopedics for reevaluation.  Please do not hesitate to return to emergency department if worrisome signs symptoms we discussed become apparent.

## 2022-07-14 NOTE — ED Notes (Signed)
Pt given discharge instructions and reviewed prescriptions. Opportunities given for questions. Pt verbalizes understanding. Novalyn Lajara R, RN 
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
## 2022-10-13 ENCOUNTER — Other Ambulatory Visit: Payer: Self-pay

## 2022-10-13 ENCOUNTER — Encounter (HOSPITAL_BASED_OUTPATIENT_CLINIC_OR_DEPARTMENT_OTHER): Payer: Self-pay | Admitting: Emergency Medicine

## 2022-10-13 ENCOUNTER — Emergency Department (HOSPITAL_BASED_OUTPATIENT_CLINIC_OR_DEPARTMENT_OTHER)
Admission: EM | Admit: 2022-10-13 | Discharge: 2022-10-13 | Disposition: A | Discharge status: Home / Self Care | Payer: Medicaid Other | Attending: Emergency Medicine | Admitting: Emergency Medicine

## 2022-10-13 DIAGNOSIS — Z1152 Encounter for screening for COVID-19: Secondary | ICD-10-CM | POA: Diagnosis not present

## 2022-10-13 DIAGNOSIS — J101 Influenza due to other identified influenza virus with other respiratory manifestations: Secondary | ICD-10-CM | POA: Diagnosis not present

## 2022-10-13 DIAGNOSIS — R0981 Nasal congestion: Secondary | ICD-10-CM | POA: Diagnosis present

## 2022-10-13 LAB — RESP PANEL BY RT-PCR (RSV, FLU A&B, COVID)  RVPGX2
Influenza A by PCR: POSITIVE — AB
Influenza B by PCR: NEGATIVE
Resp Syncytial Virus by PCR: NEGATIVE
SARS Coronavirus 2 by RT PCR: NEGATIVE

## 2022-10-13 NOTE — ED Triage Notes (Signed)
Pt arrived POV with headache, runny nose, congestion and cough since Friday. Yesterday was feeling a little better and started feeling weak, chills and more congested this am. Some SOB started this am, as well. Afebrile, VSS. Theraflu, Nyquil an dayquil taken at home. Last dose was yesterday evening.

## 2022-10-13 NOTE — Discharge Instructions (Signed)
Please return to the ED with any new or worsening signs or symptoms Your testing today was positive for influenza A. Please treat symptoms conservatively at home.  Please use ibuprofen for body aches and chills.  Please take Tylenol for headaches and fevers.  Please push fluids to include Pedialyte.  Please eat high-protein low-fat diet. Please read attached guide concerning influenza Please see attached work note

## 2022-10-13 NOTE — ED Provider Notes (Signed)
[facility] [facility] [facility] Provider Note   CSN: [id number] Arrival date & time: 10/13/22  0932     History  Chief Complaint  Patient presents with   Nasal Congestion   Headache   Cough    Shaquonda Stelma is a 22 y.o. female with medical history anemia, anxiety, depression.  Patient presents to ED for evaluation of nasal congestion, body aches and chills, weakness, fatigue since Friday.  Patient reports that her son was recently sent home with unknown viral illness.  Patient also reports that there are multiple people at work with unknown illness.  Patient sitting bodyaches and chills and weakness, fatigue, nasal congestion, fever.  Patient denies nausea, vomiting, diarrhea, sore throat, abdominal pain, one-sided weakness or numbness.  Patient states she has taken TheraFlu, NyQuil and DayQuil at home with slight relief of symptoms.  Last dose yesterday evening.   Headache Associated symptoms: cough, fatigue and fever   Associated symptoms: no abdominal pain, no diarrhea, no nausea, no vomiting and no weakness   Cough Associated symptoms: chills and fever        Home Medications Prior to Admission medications   Medication Sig Start Date End Date Taking? Authorizing Provider  cyclobenzaprine (FLEXERIL) 10 MG tablet Take 1 tablet (10 mg total) by mouth 2 (two) times daily as needed for muscle spasms. 07/14/22   Rex Kras, PA  docusate sodium (COLACE) 100 MG capsule Take 1 capsule (100 mg total) by mouth 2 (two) times daily. 11/21/20   Janyth Pupa, DO  ibuprofen (ADVIL) 600 MG tablet Take 1 tablet (600 mg total) by mouth every 6 (six) hours as needed. 11/21/20   Janyth Pupa, DO  NIFEdipine (ADALAT CC) 30 MG 24 hr tablet Take 1 tablet (30 mg total) by mouth 2 (two) times daily. 11/21/20 12/21/20  Janyth Pupa, DO  sertraline (ZOLOFT) 100 MG tablet Take 1 tablet (100 mg total) by mouth daily. 11/21/20   Janyth Pupa, DO      Allergies    Patient has no known  allergies.    Review of Systems   Review of Systems  Constitutional:  Positive for chills, fatigue and fever.  Respiratory:  Positive for cough.   Gastrointestinal:  Negative for abdominal pain, diarrhea, nausea and vomiting.  Neurological:  Negative for weakness.  All other systems reviewed and are negative.   Physical Exam Updated Vital Signs BP 120/74   Pulse 92   Temp 98.3 F (36.8 C) (Oral)   Resp 17   Ht '5\' 4"'$  (1.626 m)   Wt 77.1 kg   LMP 09/20/2022   SpO2 98%   BMI 29.18 kg/m  Physical Exam Vitals and nursing note reviewed.  Constitutional:      General: She is not in acute distress.    Appearance: She is well-developed. She is not ill-appearing, toxic-appearing or diaphoretic.  HENT:     Head: Normocephalic and atraumatic.     Nose: Nose normal.     Mouth/Throat:     Mouth: Mucous membranes are moist.     Pharynx: Oropharynx is clear. Posterior oropharyngeal erythema present.  Eyes:     Extraocular Movements: Extraocular movements intact.     Conjunctiva/sclera: Conjunctivae normal.     Pupils: Pupils are equal, round, and reactive to light.  Cardiovascular:     Rate and Rhythm: Normal rate and regular rhythm.  Pulmonary:     Effort: Pulmonary effort is normal.     Breath sounds: Normal breath sounds. No wheezing.  Abdominal:     General: Abdomen is flat. Bowel sounds are normal.     Palpations: Abdomen is soft.     Tenderness: There is no abdominal tenderness.  Musculoskeletal:     Cervical back: Normal range of motion and neck supple. No tenderness.  Skin:    General: Skin is warm and dry.     Capillary Refill: Capillary refill takes less than 2 seconds.  Neurological:     General: No focal deficit present.     Mental Status: She is alert and oriented to person, place, and time.     GCS: GCS eye subscore is 4. GCS verbal subscore is 5. GCS motor subscore is 6.     Cranial Nerves: Cranial nerves 2-12 are intact. No cranial nerve deficit.     Sensory:  Sensation is intact. No sensory deficit.     Motor: Motor function is intact. No weakness.     Coordination: Coordination is intact. Heel to [facility] Test normal.     ED Results / Procedures / Treatments   Labs (all labs ordered are listed, but only abnormal results are displayed) Labs Reviewed  RESP PANEL BY RT-PCR (RSV, FLU A&B, COVID)  RVPGX2 - Abnormal; Notable for the following components:      Result Value   Influenza A by PCR POSITIVE (*)    All other components within normal limits    EKG None  Radiology No results found.  Procedures Procedures   Medications Ordered in ED Medications - No data to display  ED Course/ Medical Decision Making/ A&P  Medical Decision Making  22 year old female presents to ED for evaluation.  Please see HPI for further details.  On examination the patient is afebrile and nontachycardic.  Lung sounds are clear bilaterally, she is not hypoxic on room air.  Abdomen soft and compressible throughout.  Posterior oropharynx is erythematous however no exudate.  Uvula midline, handling secretions appropriately, no drooling, no change in phonation.  Patient nontoxic in appearance.  Patient viral testing positive for influenza A.  Patient states symptoms began on Friday, unfortunately she is outside of the timeframe for antiviral medication initiation.  Patient will be advised to treat symptoms conservatively at home.  Patient advised to follow-up with PCP for further management.  Patient given return precautions and she voiced understanding.  Patient had all of her questions answered to her satisfaction.  Patient stable for discharge.   Final Clinical Impression(s) / ED Diagnoses Final diagnoses:  Influenza A    Rx / DC Orders ED Discharge Orders     None         Azucena Cecil, PA-C 10/13/22 1053    Regan Lemming, MD 10/13/22 1254

## 2022-10-13 NOTE — ED Notes (Signed)
Discharge paperwork given and verbally understood. 

## 2023-04-27 ENCOUNTER — Ambulatory Visit (INDEPENDENT_AMBULATORY_CARE_PROVIDER_SITE_OTHER): Payer: Medicaid Other | Admitting: Obstetrics and Gynecology

## 2023-04-27 ENCOUNTER — Other Ambulatory Visit (HOSPITAL_COMMUNITY)
Admission: RE | Admit: 2023-04-27 | Discharge: 2023-04-27 | Disposition: A | Discharge status: Home / Self Care | Payer: Medicaid Other | Source: Ambulatory Visit | Attending: Obstetrics and Gynecology | Admitting: Obstetrics and Gynecology

## 2023-04-27 ENCOUNTER — Encounter: Payer: Self-pay | Admitting: Obstetrics and Gynecology

## 2023-04-27 VITALS — BP 119/73 | HR 82 | Ht 61.0 in | Wt 179.0 lb

## 2023-04-27 DIAGNOSIS — Z01419 Encounter for gynecological examination (general) (routine) without abnormal findings: Secondary | ICD-10-CM | POA: Diagnosis present

## 2023-04-27 DIAGNOSIS — Z1339 Encounter for screening examination for other mental health and behavioral disorders: Secondary | ICD-10-CM

## 2023-04-27 MED ORDER — ESCITALOPRAM OXALATE 10 MG PO TABS: 10.0000 mg | ORAL_TABLET | Freq: Every day | ORAL | 1 refills | Status: AC

## 2023-04-27 NOTE — Progress Notes (Addendum)
22 y.o. New GYN presents for AEX/PAP/STD screening.  Pt was taking Zoloft but stopped because it made her feel slow.  C/o greyish vaginal discharge and odor.  PHQ-9=12 // GAD-7=13

## 2023-04-27 NOTE — Progress Notes (Signed)
Subjective:     Deborah Schmidt is a 22 y.o. female P1 with LMP 03/31/23 and BMI 33 who is here for a comprehensive physical exam. The patient reports presence of a discharge with odor along with postcoital vaginal bleeding for the past few months. She also desires a change in her antidepressant. Patient reports a 4-5 day period monthly. She is sexually active using Nexplanon for contraception. She denies any pelvic pain. She is without any other complaints  Past Medical History:  Diagnosis Date   Anemia    Anxiety    Depression    Gastritis    Gestational diabetes    Mental disorder    Past Surgical History:  Procedure Laterality Date   CESAREAN SECTION N/A 11/17/2020   Procedure: CESAREAN SECTION;  Surgeon: Hermina Staggers, MD;  Location: [facility] LD ORS;  Service: Obstetrics;  Laterality: N/A;  Nonreassuring fetal heart rate tracing   Family History  Problem Relation Age of Onset   Cancer Maternal Grandmother    Cancer Paternal Grandmother     Social History   Socioeconomic History   Marital status: Single    Spouse name: Not on file   Number of children: 1   Years of education: Not on file   Highest education level: Not on file  Occupational History   Not on file  Tobacco Use   Smoking status: Never   Smokeless tobacco: Current  Vaping Use   Vaping status: Every Day  Substance and Sexual Activity   Alcohol use: Yes    Comment: social   Drug use: Not Currently    Types: Marijuana   Sexual activity: Yes    Birth control/protection: Implant  Other Topics Concern   Not on file  Social History Narrative   Not on file   Social Determinants of Health   Financial Resource Strain: Not on file  Food Insecurity: Not on file  Transportation Needs: Not on file  Physical Activity: Not on file  Stress: Not on file  Social Connections: Not on file  Intimate Partner Violence: Not on file   Health Maintenance  Topic Date Due   HPV VACCINES (1 - 3-dose series) Never done    Hepatitis C Screening  Never done   DTaP/Tdap/Td (1 - Tdap) Never done   PAP-Cervical Cytology Screening  Never done   PAP SMEAR-Modifier  Never done   INFLUENZA VACCINE  Never done   COVID-19 Vaccine (1 - 2023-24 season) Never done   HIV Screening  Completed       Review of Systems Pertinent items noted in HPI and remainder of comprehensive ROS otherwise negative.   Objective:  Blood pressure 119/73, pulse 82, height 5\' 1"  (1.549 m), weight 179 lb (81.2 kg), last menstrual period 03/31/2023, unknown if currently breastfeeding.   GENERAL: Well-developed, well-nourished female in no acute distress.  HEENT: Normocephalic, atraumatic. Sclerae anicteric.  NECK: Supple. Normal thyroid.  LUNGS: Clear to auscultation bilaterally.  HEART: Regular rate and rhythm. BREASTS: Symmetric in size. No palpable masses or lymphadenopathy, skin changes, or nipple drainage. ABDOMEN: Soft, nontender, nondistended. No organomegaly. PELVIC: Normal external female genitalia. Vagina is pink and rugated.  Normal discharge. Normal appearing cervix. Uterus is normal in size. No adnexal mass or tenderness. Chaperone present during the pelvic exam EXTREMITIES: No cyanosis, clubbing, or edema, 2+ distal pulses.     Assessment:    Healthy female exam.      Plan:    Pap smear collected STI screening collected Patient will  be contacted with abnormal results Rx lexapro provided See After Visit Summary for Counseling Recommendations

## 2023-04-28 LAB — CERVICOVAGINAL ANCILLARY ONLY
Bacterial Vaginitis (gardnerella): POSITIVE — AB
Candida Glabrata: NEGATIVE
Candida Vaginitis: NEGATIVE
Chlamydia: NEGATIVE
Comment: NEGATIVE
Comment: NEGATIVE
Comment: NEGATIVE
Comment: NEGATIVE
Comment: NEGATIVE
Comment: NORMAL
Neisseria Gonorrhea: NEGATIVE
Trichomonas: POSITIVE — AB

## 2023-04-28 LAB — HEPATITIS C ANTIBODY: Hep C Virus Ab: NONREACTIVE

## 2023-04-28 LAB — HSV-2 AB, IGG: HSV 2 IgG, Type Spec: 8.47 {index} — ABNORMAL HIGH (ref 0.00–0.90)

## 2023-04-28 LAB — RPR: RPR Ser Ql: NONREACTIVE

## 2023-04-28 LAB — HEPATITIS B SURFACE ANTIGEN: Hepatitis B Surface Ag: NEGATIVE

## 2023-04-28 LAB — HIV ANTIBODY (ROUTINE TESTING W REFLEX): HIV Screen 4th Generation wRfx: NONREACTIVE

## 2023-04-28 MED ORDER — METRONIDAZOLE 500 MG PO TABS: 500.0000 mg | ORAL_TABLET | Freq: Two times a day (BID) | ORAL | 0 refills | Status: DC

## 2023-04-28 NOTE — Addendum Note (Signed)
Addended by: Catalina Antigua on: 04/28/2023 05:38 PM   Modules accepted: Orders

## 2023-04-30 LAB — CYTOLOGY - PAP
Comment: NEGATIVE
High risk HPV: POSITIVE — AB

## 2023-05-13 ENCOUNTER — Ambulatory Visit: Payer: Medicaid Other | Admitting: Emergency Medicine

## 2023-05-13 VITALS — BP 113/72 | HR 91 | Ht 61.0 in | Wt 177.0 lb

## 2023-05-13 DIAGNOSIS — Z23 Encounter for immunization: Secondary | ICD-10-CM

## 2023-07-13 ENCOUNTER — Ambulatory Visit: Payer: Medicaid Other

## 2023-07-20 ENCOUNTER — Ambulatory Visit: Payer: Medicaid Other

## 2023-07-20 VITALS — BP 115/73 | HR 73 | Wt 180.2 lb

## 2023-07-20 DIAGNOSIS — Z23 Encounter for immunization: Secondary | ICD-10-CM

## 2023-07-20 NOTE — Progress Notes (Signed)
Pt is in the office for the 2nd Gardasil injection. Administered in [place] and pt tolerated well.

## 2023-07-21 ENCOUNTER — Other Ambulatory Visit: Payer: Self-pay | Admitting: Obstetrics and Gynecology

## 2023-10-08 ENCOUNTER — Other Ambulatory Visit (HOSPITAL_COMMUNITY)
Admission: RE | Admit: 2023-10-08 | Discharge: 2023-10-08 | Disposition: A | Discharge status: Home / Self Care | Payer: Medicaid Other | Source: Ambulatory Visit | Attending: Obstetrics and Gynecology | Admitting: Obstetrics and Gynecology

## 2023-10-08 ENCOUNTER — Encounter: Payer: Self-pay | Admitting: Obstetrics and Gynecology

## 2023-10-08 ENCOUNTER — Ambulatory Visit: Payer: Medicaid Other | Admitting: Obstetrics and Gynecology

## 2023-10-08 VITALS — BP 130/79 | HR 85 | Ht 61.0 in | Wt 179.6 lb

## 2023-10-08 DIAGNOSIS — N898 Other specified noninflammatory disorders of vagina: Secondary | ICD-10-CM | POA: Insufficient documentation

## 2023-10-08 MED ORDER — VALACYCLOVIR HCL 1 G PO TABS: 1000.0000 mg | ORAL_TABLET | Freq: Two times a day (BID) | ORAL | 6 refills | Status: DC

## 2023-10-08 NOTE — Patient Instructions (Addendum)
 HPV Test Why am I having this test? Human papillomavirus (HPV) is a common virus. There are more than 100 types of HPV. Most cases of HPV infections go away on their own within 2 years, but other HPV infections are considered high-risk and may cause changes in cells that could lead to cancer. The HPV test checks for high-risk types (strains) of HPV. Strains 16 and 18 are considered the most high-risk for cancer. If you have a high-risk strain of HPV, it can increase your risk for cancer of the cervix, vagina, outer female genital area (vulva), anus, penis, and parts of the mouth and throat. What is being tested? This test checks for the DNA (genetic strands) of the HPV virus. This test is also called the HPV DNA test. HPV can be found in both males and females. However, HPV tests are only approved for use in females: Who are 39-53 years old. Who have an abnormal Pap test. Who have been treated for an abnormal Pap test in the past. Who have been treated for a high-risk HPV infection in the past. If you are a female who is over 72 years old, you may have the HPV test at the same time as a pelvic exam and Pap test (cotesting). There is a high chance of finding HPV if HPV test, pelvic exam, and Pap test are done at the same time. What kind of sample is taken?  This test requires a sample of cells from the cervix. This will be done using a small cotton swab, plastic spatula, or brush. This sample is often collected during a pelvic exam, when you are lying on your back on an exam table with feet in footrests (stirrups). How do I prepare for this test? Starting 24-48 hours before your test, or as told by your health care provider: Do not use medicines in your vagina. Do not have sex. Do not douche. You will be asked to pee (urinate) right before the test. How are the results reported? Your test results will be reported as either positive or negative for HPV. What do the results mean? A negative HPV  test result means that no HPV was found. This means it is very likely that you do not have HPV. Genital Herpes Genital herpes is a common sexually transmitted infection (STI) that is caused by a virus. The virus spreads from person to person through contact with a sore, infected saliva, or infected skin. The virus can cause itching, blisters, and sores around the genitals or rectum. During an outbreak of infection, symptoms may last for several days and then go away. However, the virus remains in the body, so more outbreaks may happen in the future. The time between outbreaks varies and can be from months to years. Genital herpes can affect anyone. It is particularly concerning for pregnant women because the virus can be passed to the baby during delivery. Genital herpes is also a concern for people who have a weak disease-fighting system (immune system). What are the causes? This condition is caused by the herpes simplex virus, type 1 or type 2 (HSV-1 or HSV-2). The virus may spread through: Sexual contact with an infected person, including vaginal, anal, and oral sex. Contact with a herpes sore. The skin. This means that you can get herpes from an infected partner even if there are no blisters or sores present. Your partner may not know that he or she is infected. What increases the risk? You are more likely to develop  this condition if: You have sex with many partners. You do not use latex or polyurethane condoms during sex. What are the signs or symptoms? Most people do not have symptoms or they have mild symptoms that may be mistaken for other skin problems. Symptoms may include: Small, red bumps near the genitals, rectum, or mouth. These bumps turn into blisters and then sores. Flu-like (influenza-like) symptoms, including: Fever. Body aches. Swollen lymph nodes. Headache. Painful urination. Pain and itching in the genital area or rectal area. Vaginal discharge. Tingling or shooting  pain in the legs and buttocks. Generally, symptoms are more severe and last longer during the first (primary) outbreak. Influenza-like symptoms are also more common during the primary outbreak. How is this diagnosed? This condition may be diagnosed based on: A physical exam. Your medical history. Blood tests. A test of a fluid sample (culture) from an open sore. How is this treated? There is no cure for this condition, but treatment with antiviral medicines can do the following: Speed up healing and relieve symptoms. Help to reduce the spread of the virus to sexual partners. Limit the chance of future outbreaks, or make future outbreaks shorter. Lessen symptoms of future outbreaks. Your health care provider may also recommend over-the-counter medicines to help with pain and itching. Follow these instructions at home: If you have an outbreak:  Keep the affected areas dry and clean. Avoid rubbing or touching blisters and sores. If you do touch blisters or sores: Wash your hands thoroughly with soap and water for at least 20 seconds. If soap and water are not available, use an alcohol-based hand sanitizer. Do not touch your eyes afterward. Sexual activity Do not have sexual contact during active outbreaks. Practice safe sex. Herpes can spread even if your partner does not have blisters or sores. Latex or polyurethane condoms and female condoms may help prevent the spread of the herpes virus. Managing pain and discomfort If directed, put ice on the painful area. To do this: Put ice in a plastic bag. Place a towel between your skin and the bag. Leave the ice on for 20 minutes, 2-3 times a day. Remove the ice if your skin turns bright red. This is very important. If you cannot feel pain, heat, or cold, you have a greater risk of damage to the area. If told, take a cool sitz bath to help relieve pain or itching. A sitz bath is a water bath that you take while sitting down in water that is  deep enough to cover your hips and buttocks. General instructions Take over-the-counter and prescription medicines only as told by your health care provider. If you were prescribed an antiviral medicine, use it as told by your health care provider. Do not stop using the antiviral even if you start to feel better. Keep all follow-up visits. This is important. How is this prevented? Use condoms. Although you can get genital herpes during sexual contact even with the use of a condom, a condom can provide some protection. Avoid having multiple sexual partners. Talk with your sexual partner about any symptoms either of you may have. Also, talk with your partner about any history of STIs. Do not have sexual contact if you have active symptoms of genital herpes. Contact a health care provider if: Your symptoms are not improving with medicine. Your symptoms return, or you have new symptoms. You have a fever. You have abdominal pain. You have redness, swelling, or pain in your eye. You notice new sores on other  parts of your body. You have had herpes and you become pregnant or plan to become pregnant. Get help right away if: You have symptoms of viral meningitis. This is rare but may happen if the virus spreads to the brain. Symptoms may include: Severe headache or stiff neck. Muscle aches. Nausea and vomiting. Sensitivity to light. Summary Genital herpes is a common sexually transmitted infection (STI) that is caused by the herpes simplex virus, type 1 or type 2 (HSV-1 or HSV-2). These viruses are most often spread through sexual contact with an infected person. You are more likely to develop this condition if you have sex with many partners or you do not use condoms during sex. Most people do not have symptoms or have mild symptoms that may be mistaken for other skin problems. Symptoms occur as outbreaks that may happen months or years apart. There is no cure for this condition, but treatment  with oral antiviral medicines can reduce symptoms, reduce the chance of spreading the virus to a partner, prevent future outbreaks, or shorten future outbreaks. This information is not intended to replace advice given to you by your health care provider. Make sure you discuss any questions you have with your health care provider. Document Revised: 05/08/2021 Document Reviewed: 05/08/2021 Elsevier Patient Education  2024 [employer]. Talk with your provider about what your results mean. Questions to ask your health care provider Ask your provider or the department that is doing the test: When will my results be ready? How will I get my results? What are my treatment options? What other tests do I need? What are my next steps? This information is not intended to replace advice given to you by your health care provider. Make sure you discuss any questions you have with your health care provider. Document Revised: 04/10/2022 Document Reviewed: 04/10/2022 Elsevier Patient Education  2024 [employer].

## 2023-10-08 NOTE — Progress Notes (Signed)
 Pt. Presents for vaginal lesions and irritation. Symptoms include; itching, and burning while urinating. Pt. States that she has been sick since Dec. Off and on with stomach aches, and vomiting. Thinks it could be due to HPV

## 2023-10-08 NOTE — Progress Notes (Signed)
  GYNECOLOGY PROGRESS NOTE  History:  Deborah Schmidt is a 23 y.o. G1P1001 presents to [facility] office today for problem gyn visit. She reports vaginal lesions, vaginal burning, itching, and tingling prior to the lesions coming out. She notes that the lesions come out right before her period. She reports she was told the lesions are "HPV". She states, "when the lesions come out they are very itchy. I try not to scratch them, because that seems to make them worse." She was given Flagyl for BV previously and "feels like it didn't help at all."  She denies h/a, dizziness, shortness of breath, n/v, or fever/chills.    The following portions of the patient's history were reviewed and updated as appropriate: allergies, current medications, past family history, past medical history, past social history, past surgical history and problem list. Last pap smear on 04/27/2023 was abnormal, LSIL with (+) HRHPV.  Review of Systems:  Pertinent items are noted in HPI.   Objective:  Physical Exam Blood pressure 130/79, pulse 85, height 5\' 1"  (1.549 m), weight 179 lb 9.6 oz (81.5 kg), last menstrual period 09/21/2023, unknown if currently breastfeeding. VS reviewed, nursing note reviewed,  Constitutional: well developed, well nourished, no distress HEENT: normocephalic CV: normal rate Pulm/chest wall: normal effort Breast Exam: deferred Abdomen: soft Neuro: alert and oriented x 3 Skin: warm, dry Psych: affect normal Pelvic exam: No lesions observed on vulva as pointed out by patient. Hairs trimmed by razor or blade. Speculum: Cervix pink, visually closed, without lesion, scant thick, white creamy discharge, vaginal walls and external genitalia normal Bimanual exam: deferred  Assessment & Plan:  1. Vaginal irritation (Primary) - Cervicovaginal ancillary only( [facility])  2. Vaginal lesion - Information provided on genital herpes - Prescription for: Valtrex 1000 mg po BID x 5 days when feeling  tingling/burning/itching sensation   Total face-to-face time spent during this encounter was 10 minutes. There was 5 minutes of chart review time spent prior to this encounter. Total time spent = 15 minutes.   Raelyn Mora, CNM 10:26 AM

## 2023-10-11 LAB — CERVICOVAGINAL ANCILLARY ONLY
Bacterial Vaginitis (gardnerella): NEGATIVE
Candida Glabrata: NEGATIVE
Candida Vaginitis: NEGATIVE
Chlamydia: NEGATIVE
Comment: NEGATIVE
Comment: NEGATIVE
Comment: NEGATIVE
Comment: NEGATIVE
Comment: NEGATIVE
Comment: NORMAL
Neisseria Gonorrhea: NEGATIVE
Trichomonas: NEGATIVE

## 2023-11-29 ENCOUNTER — Ambulatory Visit
Admission: EM | Admit: 2023-11-29 | Discharge: 2023-11-29 | Disposition: A | Discharge status: Home / Self Care | Attending: Family Medicine | Admitting: Family Medicine

## 2023-11-29 DIAGNOSIS — B349 Viral infection, unspecified: Secondary | ICD-10-CM | POA: Insufficient documentation

## 2023-11-29 DIAGNOSIS — R319 Hematuria, unspecified: Secondary | ICD-10-CM | POA: Diagnosis not present

## 2023-11-29 LAB — POCT URINALYSIS DIP (MANUAL ENTRY)
Glucose, UA: NEGATIVE mg/dL
Ketones, POC UA: NEGATIVE mg/dL
Leukocytes, UA: NEGATIVE
Nitrite, UA: NEGATIVE
Spec Grav, UA: >=1.03 — AB (ref 1.010–1.025)
Urobilinogen, UA: 0.2 U/dL
pH, UA: 6 (ref 5.0–8.0)

## 2023-11-29 LAB — POCT RAPID STREP A (OFFICE): Rapid Strep A Screen: NEGATIVE

## 2023-11-29 LAB — POCT URINE PREGNANCY: Preg Test, Ur: NEGATIVE

## 2023-11-29 LAB — POC COVID19/FLU A&B COMBO
Covid Antigen, POC: NEGATIVE
Influenza A Antigen, POC: NEGATIVE
Influenza B Antigen, POC: NEGATIVE

## 2023-11-29 MED ORDER — ONDANSETRON 4 MG PO TBDP: 4.0000 mg | ORAL_TABLET | Freq: Three times a day (TID) | ORAL | 0 refills | Status: DC | PRN

## 2023-11-29 NOTE — ED Provider Notes (Signed)
 [facility]    CSN: [id number] Arrival date & time: 11/29/23  1528      History   Chief Complaint Chief Complaint  Patient presents with   Abdominal Pain    HPI Deborah Schmidt is a 23 y.o. female  presents for evaluation of URI symptoms for 2 days. Patient reports associated symptoms of nausea/vomiting/diarrhea, abdominal cramping, headache, fatigue, sore throat with swollen glands.  No blood in vomit or stool.  No dysuria.  Denies fevers, cough, congestion, body aches, shortness of breath. Patient does not have a hx of asthma. Patient is not an active smoker.   Reports son has respiratory symptoms.  Pt has taken nothing OTC for symptoms.  No appetite but is able to take fluids and urinating normally.  Pt has no other concerns at this time.    Abdominal Pain Associated symptoms: diarrhea, fatigue, nausea, sore throat and vomiting     Past Medical History:  Diagnosis Date   Anemia    Anxiety    Depression    Gastritis    Gestational diabetes    Mental disorder     Patient Active Problem List   Diagnosis Date Noted   Normal labor 11/17/2020    Past Surgical History:  Procedure Laterality Date   CESAREAN SECTION N/A 11/17/2020   Procedure: CESAREAN SECTION;  Surgeon: Hermina Staggers, MD;  Location: [facility];  Service: Obstetrics;  Laterality: N/A;  Nonreassuring fetal heart rate tracing    OB History     Gravida  1   Para  1   Term  1   Preterm      AB      Living  1      SAB      IAB      Ectopic      Multiple  0   Live Births  1            Home Medications    Prior to Admission medications   Medication Sig Start Date End Date Taking? Authorizing Provider  escitalopram (LEXAPRO) 10 MG tablet Take 1 tablet (10 mg total) by mouth daily. 04/27/23  Yes Constant, Peggy, MD  ondansetron (ZOFRAN-ODT) 4 MG disintegrating tablet Take 1 tablet (4 mg total) by mouth every 8 (eight) hours as needed for nausea or vomiting. 11/29/23  Yes Radford Pax, NP  cyclobenzaprine (FLEXERIL) 10 MG tablet Take 1 tablet (10 mg total) by mouth 2 (two) times daily as needed for muscle spasms. Patient not taking: Reported on 10/08/2023 07/14/22   Jeanelle Malling, PA  docusate sodium (COLACE) 100 MG capsule Take 1 capsule (100 mg total) by mouth 2 (two) times daily. Patient not taking: Reported on 10/08/2023 11/21/20   Myna Hidalgo, DO  ibuprofen (ADVIL) 600 MG tablet Take 1 tablet (600 mg total) by mouth every 6 (six) hours as needed. Patient not taking: Reported on 10/08/2023 11/21/20   Myna Hidalgo, DO  metroNIDAZOLE (FLAGYL) 500 MG tablet Take 1 tablet (500 mg total) by mouth 2 (two) times daily. Patient not taking: Reported on 10/08/2023 04/28/23   Constant, Peggy, MD  NIFEdipine (ADALAT CC) 30 MG 24 hr tablet Take 1 tablet (30 mg total) by mouth 2 (two) times daily. 11/21/20 12/21/20  Myna Hidalgo, DO  sertraline (ZOLOFT) 100 MG tablet Take 1 tablet (100 mg total) by mouth daily. Patient not taking: Reported on 10/08/2023 11/21/20   Myna Hidalgo, DO  valACYclovir (VALTREX) 1000 MG tablet Take 1 tablet (1,000  mg total) by mouth 2 (two) times daily. Take for ten days. 10/08/23   Almond Army, CNM    Family History Family History  Problem Relation Age of Onset   Cancer Maternal Grandmother    Cancer Paternal Grandmother     Social History Social History   Tobacco Use   Smoking status: Never   Smokeless tobacco: Current  Vaping Use   Vaping status: Some Days  Substance Use Topics   Alcohol use: Yes    Comment: social   Drug use: Not Currently    Types: Marijuana     Allergies   Patient has no known allergies.   Review of Systems Review of Systems  Constitutional:  Positive for fatigue.  HENT:  Positive for sore throat.   Gastrointestinal:  Positive for abdominal pain, diarrhea, nausea and vomiting.  Neurological:  Positive for headaches.     Physical Exam Triage Vital Signs ED Triage Vitals  Encounter Vitals Group     BP  11/29/23 1606 134/74     Systolic BP Percentile --      Diastolic BP Percentile --      Pulse Rate 11/29/23 1606 75     Resp 11/29/23 1606 16     Temp 11/29/23 1606 98.3 F (36.8 C)     Temp Source 11/29/23 1606 Oral     SpO2 11/29/23 1606 97 %     Weight --      Height --      Head Circumference --      Peak Flow --      Pain Score 11/29/23 1603 7     Pain Loc --      Pain Education --      Exclude from Growth Chart --    No data found.  Updated Vital Signs BP 134/74 (BP Location: Right Arm)   Pulse 75   Temp 98.3 F (36.8 C) (Oral)   Resp 16   SpO2 97%   Breastfeeding No   Visual Acuity Right Eye Distance:   Left Eye Distance:   Bilateral Distance:    Right Eye Near:   Left Eye Near:    Bilateral Near:     Physical Exam Vitals and nursing note reviewed.  Constitutional:      General: She is not in acute distress.    Appearance: She is well-developed. She is not ill-appearing.  HENT:     Head: Normocephalic and atraumatic.     Right Ear: Tympanic membrane and ear canal normal.     Left Ear: Tympanic membrane and ear canal normal.     Mouth/Throat:     Mouth: Mucous membranes are moist.     Pharynx: Oropharynx is clear. Uvula midline. Posterior oropharyngeal erythema present.     Tonsils: No tonsillar exudate or tonsillar abscesses.  Eyes:     Conjunctiva/sclera: Conjunctivae normal.     Pupils: Pupils are equal, round, and reactive to light.  Cardiovascular:     Rate and Rhythm: Normal rate and regular rhythm.     Heart sounds: Normal heart sounds.  Pulmonary:     Effort: Pulmonary effort is normal.     Breath sounds: Normal breath sounds.  Abdominal:     General: Bowel sounds are normal.     Palpations: Abdomen is soft. There is no hepatomegaly or splenomegaly.     Tenderness: There is generalized abdominal tenderness. Negative signs include Rovsing's sign and McBurney's sign.  Musculoskeletal:     Cervical back: Normal  range of motion and neck  supple.  Lymphadenopathy:     Cervical: No cervical adenopathy.  Skin:    General: Skin is warm and dry.  Neurological:     General: No focal deficit present.     Mental Status: She is alert and oriented to person, place, and time.  Psychiatric:        Mood and Affect: Mood normal.        Behavior: Behavior normal.      UC Treatments / Results  Labs (all labs ordered are listed, but only abnormal results are displayed) Labs Reviewed  POCT URINALYSIS DIP (MANUAL ENTRY) - Abnormal; Notable for the following components:      Result Value   Clarity, UA hazy (*)    Bilirubin, UA small (*)    Spec Grav, UA >=1.030 (*)    Blood, UA large (*)    Protein Ur, POC trace (*)    All other components within normal limits  URINE CULTURE  POCT URINE PREGNANCY  POCT RAPID STREP A (OFFICE)  POC COVID19/FLU A&B COMBO    EKG   Radiology No results found.  Procedures Procedures (including critical care time)  Medications Ordered in UC Medications - No data to display  Initial Impression / Assessment and Plan / UC Course  I have reviewed the triage vital signs and the nursing notes.  Pertinent labs & imaging results that were available during my care of the patient were reviewed by me and considered in my medical decision making (see chart for details).     Reviewed exam and symptoms with patient.  No red flags.  Is well-appearing and in no acute distress.  UA shows blood otherwise no leukocytes or nitrates, will send for urine culture and contact for any positive results.  Negative urine hCG.  Negative rapid flu strep and COVID testing.  Discussed viral illness and symptomatic treatment.  Start Zofran.  Nausea vomiting.  Discussed rest, fluids/electrolyte replacement and bland diet.  OTC Tylenol as needed.  PCP follow-up 2 to 3 days for recheck.  ER precautions reviewed and patient verbalized understanding. Final Clinical Impressions(s) / UC Diagnoses   Final diagnoses:  Hematuria,  unspecified type  Viral illness     Discharge Instructions      You have tested negative for COVID, flu, strep throat.  Your urine was negative for UTI.  You may take Zofran as needed for nausea and/or vomiting.  Lots of rest and fluids/electrolyte replacement with Gatorade, Powerade, Pedialyte, water.  Bland diet and advance as tolerated.  You may take Tylenol over-the-counter as needed.  Please follow-up with your PCP in 2 to 3 days for recheck.  Please go to the ER if you develop any worsening symptoms.  Hope you feel better soon!     ED Prescriptions     Medication Sig Dispense Auth. Provider   ondansetron (ZOFRAN-ODT) 4 MG disintegrating tablet Take 1 tablet (4 mg total) by mouth every 8 (eight) hours as needed for nausea or vomiting. 10 tablet Sigifredo Pignato, Jodi R, NP      PDMP not reviewed this encounter.   Alleen Arbour, NP 11/29/23 [phone]

## 2023-11-29 NOTE — ED Triage Notes (Signed)
 Pt presents to UC for c/o abd pain, vomiting, headache, swollen lymph nodes, fatigue x2 days.

## 2023-11-29 NOTE — Discharge Instructions (Signed)
 You have tested negative for COVID, flu, strep throat.  Your urine was negative for UTI.  You may take Zofran as needed for nausea and/or vomiting.  Lots of rest and fluids/electrolyte replacement with Gatorade, Powerade, Pedialyte, water.  Bland diet and advance as tolerated.  You may take Tylenol over-the-counter as needed.  Please follow-up with your PCP in 2 to 3 days for recheck.  Please go to the ER if you develop any worsening symptoms.  Hope you feel better soon!

## 2023-11-30 LAB — URINE CULTURE: Culture: <10000 — AB

## 2023-12-01 ENCOUNTER — Encounter: Payer: Self-pay | Admitting: Obstetrics and Gynecology

## 2024-03-01 ENCOUNTER — Ambulatory Visit
Admission: EM | Admit: 2024-03-01 | Discharge: 2024-03-01 | Disposition: A | Discharge status: Home / Self Care | Attending: Nurse Practitioner | Admitting: Nurse Practitioner

## 2024-03-01 ENCOUNTER — Other Ambulatory Visit: Payer: Self-pay

## 2024-03-01 DIAGNOSIS — R1084 Generalized abdominal pain: Secondary | ICD-10-CM

## 2024-03-01 DIAGNOSIS — R112 Nausea with vomiting, unspecified: Secondary | ICD-10-CM | POA: Diagnosis not present

## 2024-03-01 DIAGNOSIS — R197 Diarrhea, unspecified: Secondary | ICD-10-CM | POA: Diagnosis not present

## 2024-03-01 DIAGNOSIS — K529 Noninfective gastroenteritis and colitis, unspecified: Secondary | ICD-10-CM

## 2024-03-01 HISTORY — DX: Herpesviral infection, unspecified: B00.9

## 2024-03-01 LAB — POCT URINALYSIS DIP (MANUAL ENTRY)
Bilirubin, UA: NEGATIVE
Glucose, UA: NEGATIVE mg/dL
Leukocytes, UA: NEGATIVE
Nitrite, UA: NEGATIVE
Protein Ur, POC: 30 mg/dL — AB
Spec Grav, UA: >=1.03 — AB (ref 1.010–1.025)
Urobilinogen, UA: 0.2 U/dL
pH, UA: 6 (ref 5.0–8.0)

## 2024-03-01 LAB — POCT URINE PREGNANCY: Preg Test, Ur: NEGATIVE

## 2024-03-01 MED ORDER — ONDANSETRON 8 MG PO TBDP: 8.0000 mg | ORAL_TABLET | Freq: Three times a day (TID) | ORAL | 0 refills | Status: AC | PRN

## 2024-03-01 MED ORDER — DICYCLOMINE HCL 20 MG PO TABS: 20.0000 mg | ORAL_TABLET | Freq: Three times a day (TID) | ORAL | 0 refills | Status: AC | PRN

## 2024-03-01 NOTE — Discharge Instructions (Addendum)
 You have been diagnosed with acute gastroenteritis, which is an inflammation of the stomach and intestines often caused by a viral infection or food-related irritation. This condition typically causes nausea, vomiting, diarrhea, and abdominal pain. To help manage your symptoms, take the prescribed Zofran  as needed for nausea and Bentyl  up to three times a day as needed for cramping or abdominal discomfort. Begin with a clear liquid diet such as water, broth, electrolyte drinks, or diluted juice. As your symptoms improve, slowly transition to bland foods like toast, rice, bananas, or applesauce. Avoid spicy, greasy, or dairy-based foods until you have fully recovered. Rest and stay hydrated to help your body recover. If your symptoms do not improve after a few days, follow up with your primary care provider.   Seek immediate medical attention in the emergency department if you experience worsening abdominal pain, develop a high fever, cannot keep any fluids down, feel dizzy or faint, notice blood in your stool, or have any other concerning changes in your condition.

## 2024-03-01 NOTE — ED Provider Notes (Signed)
 [facility]    CSN: [id number] Arrival date & time: 03/01/24  9188      History   Chief Complaint Chief Complaint  Patient presents with   Abdominal Pain   Emesis   Diarrhea   Headache    HPI Deborah Schmidt is a 23 y.o. female.   Discussed the use of AI scribe software for clinical note transcription with the patient, who gave verbal consent to proceed.   The patient, with a history of gastritis, presents with nausea, vomiting, diarrhea, abdominal pain, and alternating hot and cold sensations that began on Monday morning.   The patient reports feeling very not so well with episodes of vomiting, experiencing temperature fluctuations between feeling hot and cold, and currently feeling very cold. She describes having a headache and notes that eating is often followed by immediate bowel movements. The vomiting has occurred approximately 3 times yesterday and once this morning. The patient's bowel movements are frequent, with stools described as sometimes watery and sometimes soft. The abdominal pain is localized to the upper left part of the abdomen and is characterized as sharp and cramping, coming and going in nature. The patient denies black or bloody stools. She has attempted to manage her symptoms with over-the-counter medications, including Pepto-Bismol and Theraflu.  The patient prepared and consumed a meal of pot roast, mashed potatoes, and cabbage on Sunday night, which may be relevant to the onset of symptoms. She lives with her older brother and son, but reports that no one else in the household is experiencing similar symptoms. The patient's last menstrual period started on 02/12/24, and she is currently not on birth control. She reports being sexually active with one partner in the past 3 months but denies any recent sexual activity.   The following portions of the patient's history were reviewed and updated as appropriate: allergies, current medications, past family  history, past medical history, past social history, past surgical history, and problem list.       Past Medical History:  Diagnosis Date   Anemia    Anxiety    Depression    Gastritis    Gestational diabetes    HSV-2 (herpes simplex virus 2) infection    Mental disorder     Patient Active Problem List   Diagnosis Date Noted   Normal labor 11/17/2020    Past Surgical History:  Procedure Laterality Date   CESAREAN SECTION N/A 11/17/2020   Procedure: CESAREAN SECTION;  Surgeon: Lorence Ozell CROME, MD;  Location: [facility];  Service: Obstetrics;  Laterality: N/A;  Nonreassuring fetal heart rate tracing    OB History     Gravida  1   Para  1   Term  1   Preterm      AB      Living  1      SAB      IAB      Ectopic      Multiple  0   Live Births  1            Home Medications    Prior to Admission medications   Medication Sig Start Date End Date Taking? Authorizing Provider  dicyclomine  (BENTYL ) 20 MG tablet Take 1 tablet (20 mg total) by mouth 3 (three) times daily as needed (abdominal pain and/or cramping). 03/01/24  Yes Iola Lukes, FNP  ondansetron  (ZOFRAN -ODT) 8 MG disintegrating tablet Take 1 tablet (8 mg total) by mouth every 8 (eight) hours as needed for nausea  or vomiting. 03/01/24  Yes Roberta Kelly, Lucie, FNP  escitalopram  (LEXAPRO ) 10 MG tablet Take 1 tablet (10 mg total) by mouth daily. 04/27/23   Constant, Peggy, MD  valACYclovir  (VALTREX ) 1000 MG tablet Take 1 tablet (1,000 mg total) by mouth 2 (two) times daily. Take for ten days. 10/08/23   Letha Renshaw, CNM    Family History Family History  Problem Relation Age of Onset   Cancer Maternal Grandmother    Cancer Paternal Grandmother     Social History Social History   Tobacco Use   Smoking status: Never   Smokeless tobacco: Never  Vaping Use   Vaping status: Some Days  Substance Use Topics   Alcohol use: Yes    Comment: social   Drug use: Not Currently    Types:  Marijuana     Allergies   Patient has no known allergies.   Review of Systems Review of Systems  Constitutional:  Positive for appetite change (decreased) and chills (hot & cold).  Gastrointestinal:  Positive for abdominal pain (LUQ, intermittent, sharp & cramping), diarrhea (soft and watery stools), nausea and vomiting.  Genitourinary:  Negative for dysuria, menstrual problem (LMP 02/12/24) and vaginal discharge.  Neurological:  Positive for headaches (intermittent).  All other systems reviewed and are negative.    Physical Exam Triage Vital Signs ED Triage Vitals  Encounter Vitals Group     BP 03/01/24 0832 111/70     Girls Systolic BP Percentile --      Girls Diastolic BP Percentile --      Boys Systolic BP Percentile --      Boys Diastolic BP Percentile --      Pulse Rate 03/01/24 0832 63     Resp 03/01/24 0832 16     Temp 03/01/24 0832 98.3 F (36.8 C)     Temp Source 03/01/24 0832 Oral     SpO2 03/01/24 0832 97 %     Weight --      Height --      Head Circumference --      Peak Flow --      Pain Score 03/01/24 0827 8     Pain Loc --      Pain Education --      Exclude from Growth Chart --    No data found.  Updated Vital Signs BP 111/70   Pulse 63   Temp 98.3 F (36.8 C) (Oral)   Resp 16   LMP 02/12/2024   SpO2 97%   Visual Acuity Right Eye Distance:   Left Eye Distance:   Bilateral Distance:    Right Eye Near:   Left Eye Near:    Bilateral Near:     Physical Exam Vitals reviewed.  Constitutional:      General: She is awake. She is not in acute distress.    Appearance: Normal appearance. She is well-developed and well-groomed. She is not ill-appearing, toxic-appearing or diaphoretic.  HENT:     Head: Normocephalic.     Right Ear: Hearing normal.     Left Ear: Hearing normal.     Nose: Nose normal.     Mouth/Throat:     Mouth: Mucous membranes are moist.  Eyes:     General: Vision grossly intact.     Conjunctiva/sclera: Conjunctivae  normal.  Cardiovascular:     Rate and Rhythm: Normal rate and regular rhythm.     Heart sounds: Normal heart sounds.  Pulmonary:     Effort: Pulmonary effort is normal.  Breath sounds: Normal breath sounds and air entry.  Abdominal:     General: Bowel sounds are normal. There is no distension.     Palpations: Abdomen is soft.     Tenderness: There is generalized abdominal tenderness. There is no right CVA tenderness, left CVA tenderness, guarding or rebound.  Musculoskeletal:        General: Normal range of motion.     Cervical back: Full passive range of motion without pain, normal range of motion and neck supple.  Skin:    General: Skin is warm and dry.  Neurological:     General: No focal deficit present.     Mental Status: She is alert and oriented to person, place, and time.  Psychiatric:        Speech: Speech normal.        Behavior: Behavior is cooperative.      UC Treatments / Results  Labs (all labs ordered are listed, but only abnormal results are displayed) Labs Reviewed  POCT URINALYSIS DIP (MANUAL ENTRY) - Abnormal; Notable for the following components:      Result Value   Ketones, POC UA small (15) (*)    Spec Grav, UA >=1.030 (*)    Blood, UA moderate (*)    Protein Ur, POC =30 (*)    All other components within normal limits  POCT URINE PREGNANCY    EKG   Radiology No results found.  Procedures Procedures (including critical care time)  Medications Ordered in UC Medications - No data to display  Initial Impression / Assessment and Plan / UC Course  I have reviewed the triage vital signs and the nursing notes.  Pertinent labs & imaging results that were available during my care of the patient were reviewed by me and considered in my medical decision making (see chart for details).     Patient presents with nausea, vomiting, diarrhea, and diffuse abdominal pain that began Monday morning. She denies fever, and vital signs are stable. Exam  reveals generalized abdominal tenderness without guarding, rebound, or focal pain. Urinalysis is unremarkable. Given the clinical picture and lack of concerning features, the likely diagnosis is acute viral gastroenteritis, though foodborne illness or gastritis flare are also considered. A CT scan was discussed but not pursued due to the patient's well appearance and stable vitals. Treatment includes zofran  as needed for nausea and Bentyl  for abdominal cramping. Advised to follow a clear liquid diet with gradual reintroduction of bland foods. Patient was instructed to go to the emergency department if symptoms worsen, if unable to tolerate oral intake, or if new concerning symptoms arise. Follow-up with primary care is recommended if symptoms persist.  Today's evaluation has revealed no signs of a dangerous process. Discussed diagnosis with patient and/or guardian. Patient and/or guardian aware of their diagnosis, possible red flag symptoms to watch out for and need for close follow up. Patient and/or guardian understands verbal and written discharge instructions. Patient and/or guardian comfortable with plan and disposition.  Patient and/or guardian has a clear mental status at this time, good insight into illness (after discussion and teaching) and has clear judgment to make decisions regarding their care  Documentation was completed with the aid of voice recognition software. Transcription may contain typographical errors. Final Clinical Impressions(s) / UC Diagnoses   Final diagnoses:  Generalized abdominal pain  Nausea and vomiting, unspecified vomiting type  Diarrhea, unspecified type  Acute gastroenteritis     Discharge Instructions      You have been  diagnosed with acute gastroenteritis, which is an inflammation of the stomach and intestines often caused by a viral infection or food-related irritation. This condition typically causes nausea, vomiting, diarrhea, and abdominal pain. To help  manage your symptoms, take the prescribed Zofran  as needed for nausea and Bentyl  up to three times a day as needed for cramping or abdominal discomfort. Begin with a clear liquid diet such as water, broth, electrolyte drinks, or diluted juice. As your symptoms improve, slowly transition to bland foods like toast, rice, bananas, or applesauce. Avoid spicy, greasy, or dairy-based foods until you have fully recovered. Rest and stay hydrated to help your body recover. If your symptoms do not improve after a few days, follow up with your primary care provider.   Seek immediate medical attention in the emergency department if you experience worsening abdominal pain, develop a high fever, cannot keep any fluids down, feel dizzy or faint, notice blood in your stool, or have any other concerning changes in your condition.      ED Prescriptions     Medication Sig Dispense Auth. Provider   ondansetron  (ZOFRAN -ODT) 8 MG disintegrating tablet Take 1 tablet (8 mg total) by mouth every 8 (eight) hours as needed for nausea or vomiting. 12 tablet Iola Lukes, FNP   dicyclomine  (BENTYL ) 20 MG tablet Take 1 tablet (20 mg total) by mouth 3 (three) times daily as needed (abdominal pain and/or cramping). 21 tablet Iola Lukes, FNP      PDMP not reviewed this encounter.   Iola Chaparrito, [place] 03/01/24 [phone]

## 2024-03-01 NOTE — ED Triage Notes (Signed)
 Pt c/o HA, LUQ abdominal pain, N/V/D, chillsx2d.

## 2024-04-12 ENCOUNTER — Ambulatory Visit: Admitting: Physician Assistant

## 2024-04-27 ENCOUNTER — Ambulatory Visit: Admitting: Family Medicine

## 2024-04-27 ENCOUNTER — Encounter: Payer: Self-pay | Admitting: Family Medicine

## 2024-04-27 VITALS — BP 126/77 | HR 77 | Ht 61.0 in | Wt 189.0 lb

## 2024-04-27 DIAGNOSIS — Z3046 Encounter for surveillance of implantable subdermal contraceptive: Secondary | ICD-10-CM | POA: Diagnosis not present

## 2024-04-27 NOTE — Progress Notes (Signed)
 Pt presents for Nexplanon removal. Pt does not desire any BC at this time   Last PAP 04-27-23

## 2024-04-27 NOTE — Progress Notes (Signed)
 PROCEDURE NOTE: NEXPLANON  REMOVAL Jenifer Struve presents today for Nexplanon removal. She is not interested in starting other contraception right now. Overall, Nexplanon worked well for her, though she reports new headaches and breakthrough bleeding within the past month or two.  Patient given informed consent and signed copy in the chart for both procedures. An appropriate time out was taken. Left arm area prepped and draped in the usual sterile fashion. Three cc of 1% lidocaine  with epinephrine  used for local anesthesia. A small stab incision was made close to the nexplanon with scalpel. Hemostats were used to withdraw the Nexplanon. Bleeding minimal.  Incision covered with steri-strip and pressure dressing.  There were no complications and patient tolerated well.  Patient given follow up instructions should she experience redness, swelling at site, or fever in the next 24 hours.   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  Joesph DELENA Sear, PA

## 2024-05-25 ENCOUNTER — Other Ambulatory Visit (HOSPITAL_COMMUNITY)
Admission: RE | Admit: 2024-05-25 | Discharge: 2024-05-25 | Disposition: A | Discharge status: Home / Self Care | Source: Ambulatory Visit | Attending: Physician Assistant | Admitting: Physician Assistant

## 2024-05-25 ENCOUNTER — Encounter: Payer: Self-pay | Admitting: Physician Assistant

## 2024-05-25 ENCOUNTER — Ambulatory Visit: Admitting: Physician Assistant

## 2024-05-25 VITALS — BP 126/75 | HR 95 | Ht 61.0 in | Wt 189.8 lb

## 2024-05-25 DIAGNOSIS — Z0000 Encounter for general adult medical examination without abnormal findings: Secondary | ICD-10-CM | POA: Diagnosis present

## 2024-05-25 DIAGNOSIS — Z3200 Encounter for pregnancy test, result unknown: Secondary | ICD-10-CM

## 2024-05-25 DIAGNOSIS — Z3202 Encounter for pregnancy test, result negative: Secondary | ICD-10-CM

## 2024-05-25 DIAGNOSIS — N9089 Other specified noninflammatory disorders of vulva and perineum: Secondary | ICD-10-CM

## 2024-05-25 LAB — POCT URINE PREGNANCY: Preg Test, Ur: NEGATIVE

## 2024-05-25 NOTE — Progress Notes (Signed)
 ANNUAL EXAM Patient name: Deborah Schmidt MRN [id number]  Date of birth: [birth year] Chief Complaint:   No chief complaint on file.  History of Present Illness:   Deborah Schmidt is a 23 y.o. G12P1001 female being seen today for a routine annual exam.   Current complaints: Believes she has a vulvar tear from intercourse  Patient's last menstrual period was 04/30/2024 (exact date).  The pregnancy intention screening data noted above was reviewed. Potential methods of contraception were discussed. The patient elected to proceed with No data recorded.   Last pap 04/18/23, LSIL HR HPV positive Last mammogram: Never done. Results were: N/A. Family h/o breast cancer: yes maternal grandmother. Last colonoscopy: Never done. Results were: N/A. Family h/o colorectal cancer: no STI screening: Politely declines Contraception: None     04/27/2023    2:36 PM  Depression screen PHQ 2/9  Decreased Interest 0  Down, Depressed, Hopeless 0  PHQ - 2 Score 0  Altered sleeping 1  Tired, decreased energy 2  Change in appetite 2  Feeling bad or failure about yourself  2  Trouble concentrating 2  Moving slowly or fidgety/restless 2  Suicidal thoughts 1  PHQ-9 Score 12  Difficult doing work/chores Very difficult        04/27/2023    2:38 PM  GAD 7 : Generalized Anxiety Score  Nervous, Anxious, on Edge 2  Control/stop worrying 2  Worry too much - different things 2  Trouble relaxing 2  Restless 1  Easily annoyed or irritable 2  Afraid - awful might happen 2  Total GAD 7 Score 13  Anxiety Difficulty Somewhat difficult     Review of Systems:   Pertinent items are noted in HPI Denies any headaches, blurred vision, fatigue, shortness of breath, chest pain, abdominal pain, abnormal vaginal discharge/itching/odor/irritation, problems with periods, bowel movements, urination, or intercourse unless otherwise stated above. Pertinent History Reviewed:  Reviewed past medical,surgical, social and family  history.  Reviewed problem list, medications and allergies. Physical Assessment:   Vitals:   05/25/24 1537  BP: 126/75  Pulse: 95  Weight: 189 lb 12.8 oz (86.1 kg)  Height: 5' 1 (1.549 m)  Body mass index is 35.86 kg/m.        Physical Examination:   General appearance - well appearing, and in no distress  Mental status - alert, oriented to person, place, and time  Psych:  She has a normal mood and affect  Skin - warm and dry, normal color, no suspicious lesions noted  Chest - effort normal, all lung fields clear to auscultation bilaterally  Heart - normal rate and regular rhythm  Neck:  midline trachea, no thyromegaly or nodules  Breasts - breasts appear normal, no suspicious masses, no skin or nipple changes or  axillary nodes  Abdomen - soft, nontender, nondistended, no masses or organomegaly  Pelvic - VULVA: normal appearing vulva with no masses, tenderness or lesions  VAGINA: normal appearing vagina with normal color and discharge, no lesions  CERVIX: normal appearing cervix without discharge or lesions, no CMT  Thin prep pap is done   UTERUS: uterus is felt to be normal size, shape, consistency and nontender   ADNEXA: No adnexal masses or tenderness noted.  Extremities:  No swelling or varicosities noted  Chaperone present for exam  No results found for this or any previous visit (from the past 24 hours).  Assessment & Plan:  1. Annual physical exam (Primary) - Cervical cancer screening: Discussed screening Q3 years. Reviewed importance  of annual exams and limits of pap smear. Pap with reflex HPV updated today - GC/CT: Discussed and recommended. Pt declines - Birth Control: None - Breast Health: Encouraged self breast awareness/exams. Teaching provided. - Mammogram: @ 25yo, or sooner if problems - Colonoscopy: @ 25yo, or sooner if problems - Follow-up: 12 months and prn  - Cytology - PAP( [place])  2. Possible pregnancy - POCT urine pregnancy  3. Labial  lesion No lesion visible today, likely well into healing process and patient is having no pain. Reminded patient re: use of lubrication.   Orders Placed This Encounter  Procedures   POCT urine pregnancy   Meds: No orders of the defined types were placed in this encounter.  Follow-up: No follow-ups on file.  Zorion Nims E Deborah Schmidt, [place] 05/25/2024 4:23 PM

## 2024-05-25 NOTE — Progress Notes (Signed)
 Last PAP on 04/26/24 LSIL.  Pt concerned for vaginal injury/ possible tear during intercourse last weekend. No longer hurting, but some mild burning when using the restroom. Was some spotting when she wiped, but has since cleared.   Declined STD testing today.

## 2024-05-31 LAB — CYTOLOGY - PAP: Diagnosis: NEGATIVE

## 2024-06-03 ENCOUNTER — Ambulatory Visit: Payer: Self-pay | Admitting: Physician Assistant

## 2024-06-08 ENCOUNTER — Ambulatory Visit
Admission: EM | Admit: 2024-06-08 | Discharge: 2024-06-08 | Disposition: A | Discharge status: Home / Self Care | Source: Ambulatory Visit | Attending: Family Medicine | Admitting: Family Medicine

## 2024-06-08 DIAGNOSIS — J029 Acute pharyngitis, unspecified: Secondary | ICD-10-CM | POA: Insufficient documentation

## 2024-06-08 LAB — POC COVID19/FLU A&B COMBO
Covid Antigen, POC: NEGATIVE
Influenza A Antigen, POC: NEGATIVE
Influenza B Antigen, POC: NEGATIVE

## 2024-06-08 LAB — POCT RAPID STREP A (OFFICE): Rapid Strep A Screen: NEGATIVE

## 2024-06-08 MED ORDER — AMOXICILLIN 400 MG/5ML PO SUSR: 500.0000 mg | Freq: Two times a day (BID) | ORAL | 0 refills | Status: AC

## 2024-06-08 NOTE — ED Triage Notes (Signed)
 Pt present with c/o body aches x two days. Pt states she also has a sore throat and headaches. Pt reports she has white spots on the back of her throat and swollen tonsils.

## 2024-06-08 NOTE — Discharge Instructions (Signed)
 The clinic will contact you with results of the strep throat culture done today.  Start amoxicillin twice daily for 10 days.  Use over-the-counter Tylenol  ibuprofen  as needed for fever management.  Continue salt water gargle and warm liquids such as teas and honey.  Lots of rest and fluids.  Please follow-up with your PCP in 2 to 3 days for recheck.  Please go to the ER for any worsening symptoms.  Hope you feel better soon!

## 2024-06-08 NOTE — ED Provider Notes (Signed)
 [facility]    CSN: [id number] Arrival date & time: 06/08/24  1102      History   Chief Complaint Chief Complaint  Patient presents with   Sore Throat   Generalized Body Aches    HPI Deborah Schmidt is a 23 y.o. female  presents for evaluation of URI symptoms for 2 days. Patient reports associated symptoms of sore throat, body aches, headache, fever, congestion, nausea. Denies V/D, cough, ear pain, shortness of breath. Patient does note have a hx of asthma. Patient is not an active smoker.   Reports no known sick contacts but does work with the public.  Pt has taken TheraFlu OTC for symptoms. Pt has no other concerns at this time.    Sore Throat Associated symptoms include headaches.    Past Medical History:  Diagnosis Date   Anemia    Anxiety    Depression    Gastritis    Gestational diabetes    HSV-2 (herpes simplex virus 2) infection    Mental disorder     Patient Active Problem List   Diagnosis Date Noted   Normal labor 11/17/2020    Past Surgical History:  Procedure Laterality Date   CESAREAN SECTION N/A 11/17/2020   Procedure: CESAREAN SECTION;  Surgeon: Lorence Ozell CROME, MD;  Location: [facility];  Service: Obstetrics;  Laterality: N/A;  Nonreassuring fetal heart rate tracing    OB History     Gravida  1   Para  1   Term  1   Preterm      AB      Living  1      SAB      IAB      Ectopic      Multiple  0   Live Births  1            Home Medications    Prior to Admission medications   Medication Sig Start Date End Date Taking? Authorizing Provider  amoxicillin (AMOXIL) 400 MG/5ML suspension Take 6.3 mLs (500 mg total) by mouth 2 (two) times daily for 10 days. 06/08/24 06/18/24 Yes Shantell Belongia, Jodi R, NP  dicyclomine  (BENTYL ) 20 MG tablet Take 1 tablet (20 mg total) by mouth 3 (three) times daily as needed (abdominal pain and/or cramping). 03/01/24   Murrill, Samantha, FNP  escitalopram  (LEXAPRO ) 10 MG tablet Take 1 tablet (10 mg  total) by mouth daily. 04/27/23   Constant, Peggy, MD  ondansetron  (ZOFRAN -ODT) 8 MG disintegrating tablet Take 1 tablet (8 mg total) by mouth every 8 (eight) hours as needed for nausea or vomiting. 03/01/24   Iola Lukes, FNP  valACYclovir  (VALTREX ) 1000 MG tablet Take 1 tablet (1,000 mg total) by mouth 2 (two) times daily. Take for ten days. 10/08/23   Letha Renshaw, CNM    Family History Family History  Problem Relation Age of Onset   Cancer Maternal Grandmother    Cancer Paternal Grandmother     Social History Social History   Tobacco Use   Smoking status: Never   Smokeless tobacco: Never  Vaping Use   Vaping status: Some Days  Substance Use Topics   Alcohol use: Yes    Comment: social   Drug use: Not Currently    Types: Marijuana     Allergies   Patient has no known allergies.   Review of Systems Review of Systems  Constitutional:  Positive for fever.  HENT:  Positive for congestion and sore throat.   Neurological:  Positive  for headaches.     Physical Exam Triage Vital Signs ED Triage Vitals  Encounter Vitals Group     BP 06/08/24 1135 110/78     Girls Systolic BP Percentile --      Girls Diastolic BP Percentile --      Boys Systolic BP Percentile --      Boys Diastolic BP Percentile --      Pulse Rate 06/08/24 1135 91     Resp 06/08/24 1135 17     Temp 06/08/24 1135 100.2 F (37.9 C)     Temp Source 06/08/24 1135 Oral     SpO2 06/08/24 1135 96 %     Weight --      Height --      Head Circumference --      Peak Flow --      Pain Score 06/08/24 1134 7     Pain Loc --      Pain Education --      Exclude from Growth Chart --    No data found.  Updated Vital Signs BP 110/78   Pulse 91   Temp 100.2 F (37.9 C) (Oral)   Resp 17   LMP 05/31/2024 (Exact Date)   SpO2 96%   Visual Acuity Right Eye Distance:   Left Eye Distance:   Bilateral Distance:    Right Eye Near:   Left Eye Near:    Bilateral Near:     Physical Exam Vitals and  nursing note reviewed.  Constitutional:      General: She is not in acute distress.    Appearance: She is well-developed. She is not ill-appearing.  HENT:     Head: Normocephalic and atraumatic.     Right Ear: Tympanic membrane and ear canal normal.     Left Ear: Tympanic membrane and ear canal normal.     Nose: Congestion present.     Mouth/Throat:     Mouth: Mucous membranes are moist.     Pharynx: Uvula midline. Oropharyngeal exudate and posterior oropharyngeal erythema present.     Tonsils: Tonsillar exudate present. No tonsillar abscesses. 3+ on the right. 2+ on the left.  Eyes:     Conjunctiva/sclera: Conjunctivae normal.     Pupils: Pupils are equal, round, and reactive to light.  Cardiovascular:     Rate and Rhythm: Normal rate and regular rhythm.     Heart sounds: Normal heart sounds.  Pulmonary:     Effort: Pulmonary effort is normal.     Breath sounds: Normal breath sounds.  Musculoskeletal:     Cervical back: Normal range of motion and neck supple.  Lymphadenopathy:     Cervical: Cervical adenopathy present.  Skin:    General: Skin is warm and dry.  Neurological:     General: No focal deficit present.     Mental Status: She is alert and oriented to person, place, and time.  Psychiatric:        Mood and Affect: Mood normal.        Behavior: Behavior normal.    Centor criteria   Tonsillar exudates 1  Tender anterior cervical lymphadenopathy 1  Fever 1  Absence of cough 1  The Centor criteria are used to determine the likelihood of GAS in adults. One point is given for each criterion;  the likelihood of GAS pharyngitis increases as total points rise.  We generally test for GAS in patients with >=3 Centor criteria; patients with Centor criteria <3 are unlikely to have  GAS pharyngitis     UC Treatments / Results  Labs (all labs ordered are listed, but only abnormal results are displayed) Labs Reviewed  CULTURE, GROUP A STREP ([facility])  CULTURE, GROUP A STREP  [facility])  POCT RAPID STREP A (OFFICE)  POC COVID19/FLU A&B COMBO    EKG   Radiology No results found.  Procedures Procedures (including critical care time)  Medications Ordered in UC Medications - No data to display  Initial Impression / Assessment and Plan / UC Course  I have reviewed the triage vital signs and the nursing notes.  Pertinent labs & imaging results that were available during my care of the patient were reviewed by me and considered in my medical decision making (see chart for details).     Reviewed exam and symptoms with patient.  Negative rapid COVID and flu testing.  Will send strep throat culture.  Based on exam and symptom score will start amoxicillin twice daily for 10 days, patient requested liquid.  Advised to continue with salt water gargles and warm liquids and OTC analgesics/fever reduction medications as needed.  PCP follow-up in 2 to 3 days for recheck.  ER precautions reviewed. Final Clinical Impressions(s) / UC Diagnoses   Final diagnoses:  Sore throat  Acute pharyngitis, unspecified etiology     Discharge Instructions      The clinic will contact you with results of the strep throat culture done today.  Start amoxicillin twice daily for 10 days.  Use over-the-counter Tylenol  ibuprofen  as needed for fever management.  Continue salt water gargle and warm liquids such as teas and honey.  Lots of rest and fluids.  Please follow-up with your PCP in 2 to 3 days for recheck.  Please go to the ER for any worsening symptoms.  Hope you feel better soon!     ED Prescriptions     Medication Sig Dispense Auth. Provider   amoxicillin (AMOXIL) 400 MG/5ML suspension Take 6.3 mLs (500 mg total) by mouth 2 (two) times daily for 10 days. 126 mL Shaylynn Nulty, Jodi R, NP      PDMP not reviewed this encounter.   Loreda Myla SAUNDERS, NP 06/08/24 [phone]

## 2024-06-11 LAB — CULTURE, GROUP A STREP (THRC)

## 2024-07-11 ENCOUNTER — Other Ambulatory Visit: Payer: Self-pay

## 2024-07-11 ENCOUNTER — Other Ambulatory Visit: Payer: Self-pay | Admitting: Obstetrics and Gynecology

## 2024-07-11 DIAGNOSIS — N898 Other specified noninflammatory disorders of vagina: Secondary | ICD-10-CM

## 2024-07-11 MED ORDER — VALACYCLOVIR HCL 1 G PO TABS: 1000.0000 mg | ORAL_TABLET | Freq: Two times a day (BID) | ORAL | 6 refills | Status: AC
